# Patient Record
Sex: Male | Born: 1960 | Race: White | Hispanic: No | Marital: Married | Smoking: Never smoker
Health system: Southern US, Community
[De-identification: ages and names within clinical notes are randomized; demographics above are authoritative.]

## PROBLEM LIST (undated history)

## (undated) DIAGNOSIS — G4733 Obstructive sleep apnea (adult) (pediatric): Secondary | ICD-10-CM

## (undated) DIAGNOSIS — G4730 Sleep apnea, unspecified: Secondary | ICD-10-CM

## (undated) DIAGNOSIS — J302 Other seasonal allergic rhinitis: Secondary | ICD-10-CM

## (undated) HISTORY — DX: Other seasonal allergic rhinitis: J30.2

## (undated) HISTORY — DX: Sleep apnea, unspecified: G47.30

## (undated) HISTORY — DX: Obstructive sleep apnea (adult) (pediatric): G47.33

---

## 2008-12-26 DIAGNOSIS — G4733 Obstructive sleep apnea (adult) (pediatric): Secondary | ICD-10-CM

## 2008-12-26 HISTORY — DX: Obstructive sleep apnea (adult) (pediatric): G47.33

## 2013-12-03 LAB — HM COLONOSCOPY

## 2014-04-25 HISTORY — PX: COLONOSCOPY: SHX174

## 2015-11-09 ENCOUNTER — Telehealth: Payer: Self-pay | Admitting: General Practice

## 2015-11-09 NOTE — Telephone Encounter (Signed)
Lmovm to call back to schedule a new patient appt

## 2015-11-09 NOTE — Telephone Encounter (Signed)
yes

## 2015-11-09 NOTE — Telephone Encounter (Signed)
Pt is new to the area and some colleagues he works with had recommended you to him. Can you take him on as a new patient? Please advise

## 2015-12-07 ENCOUNTER — Ambulatory Visit (INDEPENDENT_AMBULATORY_CARE_PROVIDER_SITE_OTHER): Payer: 59 | Admitting: Internal Medicine

## 2015-12-07 ENCOUNTER — Other Ambulatory Visit (INDEPENDENT_AMBULATORY_CARE_PROVIDER_SITE_OTHER): Payer: 59

## 2015-12-07 ENCOUNTER — Encounter: Payer: Self-pay | Admitting: Internal Medicine

## 2015-12-07 VITALS — BP 108/68 | HR 65 | Temp 98.6°F | Resp 16 | Ht 72.0 in | Wt 280.0 lb

## 2015-12-07 DIAGNOSIS — G4733 Obstructive sleep apnea (adult) (pediatric): Secondary | ICD-10-CM

## 2015-12-07 DIAGNOSIS — Z0000 Encounter for general adult medical examination without abnormal findings: Secondary | ICD-10-CM | POA: Diagnosis not present

## 2015-12-07 DIAGNOSIS — R7989 Other specified abnormal findings of blood chemistry: Secondary | ICD-10-CM | POA: Diagnosis not present

## 2015-12-07 LAB — CBC WITH DIFFERENTIAL/PLATELET
Basophils Absolute: 0 10*3/uL (ref 0.0–0.1)
Basophils Relative: 0.2 % (ref 0.0–3.0)
Eosinophils Absolute: 0 10*3/uL (ref 0.0–0.7)
Eosinophils Relative: 0.6 % (ref 0.0–5.0)
HCT: 43.4 % (ref 39.0–52.0)
Hemoglobin: 14.6 g/dL (ref 13.0–17.0)
Lymphocytes Relative: 24.6 % (ref 12.0–46.0)
Lymphs Abs: 1.7 10*3/uL (ref 0.7–4.0)
MCHC: 33.6 g/dL (ref 30.0–36.0)
MCV: 85.2 fl (ref 78.0–100.0)
Monocytes Absolute: 0.7 10*3/uL (ref 0.1–1.0)
Monocytes Relative: 10.2 % (ref 3.0–12.0)
Neutro Abs: 4.5 10*3/uL (ref 1.4–7.7)
Neutrophils Relative %: 64.4 % (ref 43.0–77.0)
Platelets: 233 10*3/uL (ref 150.0–400.0)
RBC: 5.1 Mil/uL (ref 4.22–5.81)
RDW: 13.8 % (ref 11.5–15.5)
WBC: 7 10*3/uL (ref 4.0–10.5)

## 2015-12-07 LAB — LIPID PANEL
Cholesterol: 155 mg/dL (ref 0–200)
HDL: 32.9 mg/dL — ABNORMAL LOW (ref >39.00)
NonHDL: 121.62
Total CHOL/HDL Ratio: 5
Triglycerides: 285 mg/dL — ABNORMAL HIGH (ref 0.0–149.0)
VLDL: 57 mg/dL — ABNORMAL HIGH (ref 0.0–40.0)

## 2015-12-07 LAB — COMPREHENSIVE METABOLIC PANEL
ALT: 17 U/L (ref 0–53)
AST: 18 U/L (ref 0–37)
Albumin: 4.2 g/dL (ref 3.5–5.2)
Alkaline Phosphatase: 56 U/L (ref 39–117)
BUN: 12 mg/dL (ref 6–23)
CO2: 32 mEq/L (ref 19–32)
Calcium: 9.2 mg/dL (ref 8.4–10.5)
Chloride: 104 mEq/L (ref 96–112)
Creatinine, Ser: 1 mg/dL (ref 0.40–1.50)
GFR: 83.02 mL/min (ref >60.00)
Glucose, Bld: 101 mg/dL — ABNORMAL HIGH (ref 70–99)
Potassium: 4.4 mEq/L (ref 3.5–5.1)
Sodium: 142 mEq/L (ref 135–145)
Total Bilirubin: 0.4 mg/dL (ref 0.2–1.2)
Total Protein: 6.4 g/dL (ref 6.0–8.3)

## 2015-12-07 LAB — LDL CHOLESTEROL, DIRECT: Direct LDL: 94 mg/dL

## 2015-12-07 LAB — FECAL OCCULT BLOOD, GUAIAC: Fecal Occult Blood: NEGATIVE

## 2015-12-07 LAB — PSA: PSA: 1.19 ng/mL (ref 0.10–4.00)

## 2015-12-07 LAB — TSH: TSH: 0.94 u[IU]/mL (ref 0.35–4.50)

## 2015-12-07 NOTE — Progress Notes (Signed)
Subjective:  Patient ID: Geoffrey Evans, male    DOB: Mar 22, 1961  Age: 54 y.o. MRN: [id number]  CC: Annual Exam  New to me - no records available from [place].   HPI Geoffrey Evans presents for a CPX, he needs a CPAP titration and may need new equipment. He complains of weight gain over the last year.  History Geoffrey Evans has a past medical history of OSA (obstructive sleep apnea) (2010).   He has no past surgical history on file.   His family history includes Diabetes in his brother; Heart failure in his father. There is no history of Cancer, Alcohol abuse, Early death, Hyperlipidemia, Hypertension, Kidney disease, Learning disabilities, Stroke, COPD, or Depression.He reports that he has never smoked. He has never used smokeless tobacco. He reports that he does not drink alcohol or use illicit drugs.  No outpatient prescriptions prior to visit.   No facility-administered medications prior to visit.    ROS Review of Systems  Constitutional: Positive for unexpected weight change (wt gain). Negative for fever, chills, diaphoresis, activity change, appetite change and fatigue.  HENT: Negative.   Eyes: Negative.   Respiratory: Positive for apnea. Negative for cough, choking, chest tightness, shortness of breath, wheezing and stridor.   Cardiovascular: Negative.  Negative for chest pain, palpitations and leg swelling.  Gastrointestinal: Negative.  Negative for nausea, vomiting, abdominal pain, diarrhea, constipation and blood in stool.  Endocrine: Negative.   Genitourinary: Negative.  Negative for dysuria, urgency, frequency, hematuria, flank pain, scrotal swelling, difficulty urinating and testicular pain.  Musculoskeletal: Negative.  Negative for myalgias, back pain, joint swelling and arthralgias.  Skin: Negative.  Negative for color change.  Allergic/Immunologic: Negative.   Neurological: Negative.  Negative for dizziness, tremors, syncope, light-headedness and numbness.    Hematological: Negative.  Negative for adenopathy. Does not bruise/bleed easily.  Psychiatric/Behavioral: Negative.     Objective:  BP 108/68 mmHg  Pulse 65  Temp(Src) 98.6 F (37 C) (Oral)  Resp 16  Ht 6' (1.829 m)  Wt 280 lb (127.007 kg)  BMI 37.97 kg/m2  SpO2 96%  Physical Exam  Constitutional: He is oriented to person, place, and time. No distress.  HENT:  Mouth/Throat: Oropharynx is clear and moist. No oropharyngeal exudate.  Eyes: Conjunctivae are normal. Right eye exhibits no discharge. Left eye exhibits no discharge. No scleral icterus.  Neck: Normal range of motion. Neck supple. No JVD present. No tracheal deviation present. No thyromegaly present.  Cardiovascular: Normal rate, regular rhythm, normal heart sounds and intact distal pulses.  Exam reveals no gallop and no friction rub.   No murmur heard. Pulmonary/Chest: Effort normal and breath sounds normal. No stridor. No respiratory distress. He has no wheezes. He has no rales. He exhibits no tenderness.  Abdominal: Soft. Bowel sounds are normal. He exhibits no distension and no mass. There is no tenderness. There is no rebound and no guarding. Hernia confirmed negative in the right inguinal area and confirmed negative in the left inguinal area.  Genitourinary: Rectum normal, prostate normal, testes normal and penis normal. Rectal exam shows no external hemorrhoid, no internal hemorrhoid, no fissure, no mass, no tenderness and anal tone normal. Guaiac negative stool. Prostate is not enlarged and not tender. Right testis shows no mass, no swelling and no tenderness. Right testis is descended. Left testis shows no mass, no swelling and no tenderness. Left testis is descended. Circumcised. No penile erythema or penile tenderness. No discharge found.  Musculoskeletal: Normal range of motion. He exhibits no  edema or tenderness.  Lymphadenopathy:    He has no cervical adenopathy.       Right: No inguinal adenopathy present.        Left: No inguinal adenopathy present.  Neurological: He is oriented to person, place, and time.  Skin: Skin is warm and dry. No rash noted. He is not diaphoretic. No erythema. No pallor.  Psychiatric: He has a normal mood and affect. His behavior is normal. Judgment and thought content normal.  Vitals reviewed.     Assessment & Plan:   Geoffrey Evans was seen today for annual exam.  Diagnoses and all orders for this visit:  Routine general medical examination at a health care facility- he will have his medical records forwarded to me, vaccines were reviewed and updated today, exam done, labs ordered and reviewed, his colonoscopy is UTD, pt ed material was given. -     Lipid panel; Future -     TSH; Future -     Comprehensive metabolic panel; Future -     CBC with Differential/Platelet; Future -     PSA; Future -     Hepatitis C antibody; Future  OSA (obstructive sleep apnea) -     Ambulatory referral to Pulmonology  Geoffrey Evans does not currently have medications on file.  No orders of the defined types were placed in this encounter.     Follow-up: No Follow-up on file.  Scarlette Calico, MD

## 2015-12-07 NOTE — Progress Notes (Signed)
Pre visit review using our clinic review tool, if applicable. No additional management support is needed unless otherwise documented below in the visit note. 

## 2015-12-07 NOTE — Patient Instructions (Signed)

## 2015-12-08 ENCOUNTER — Encounter: Payer: Self-pay | Admitting: Internal Medicine

## 2015-12-08 LAB — HEPATITIS C ANTIBODY: HCV Ab: NEGATIVE

## 2016-03-23 ENCOUNTER — Ambulatory Visit (INDEPENDENT_AMBULATORY_CARE_PROVIDER_SITE_OTHER): Payer: 59 | Admitting: Pulmonary Disease

## 2016-03-23 ENCOUNTER — Encounter: Payer: Self-pay | Admitting: Pulmonary Disease

## 2016-03-23 VITALS — BP 110/82 | HR 54 | Ht 72.0 in | Wt 276.6 lb

## 2016-03-23 DIAGNOSIS — G4733 Obstructive sleep apnea (adult) (pediatric): Secondary | ICD-10-CM | POA: Diagnosis not present

## 2016-03-23 NOTE — Assessment & Plan Note (Signed)
Home sleep study Based on this , we will get you a new CPAP machine Meanwhile, Rx for new CPAP supplies  The pathophysiology of obstructive sleep apnea , it's cardiovascular consequences & modes of treatment including CPAP were discused with the patient in detail & they evidenced understanding.

## 2016-03-23 NOTE — Progress Notes (Signed)
   Subjective:    Patient ID: Geoffrey Evans, male    DOB: [birth year], 55 y.o.   MRN: [id number]  HPI    Review of Systems  Constitutional: Negative for fever, chills, activity change, appetite change and unexpected weight change.  HENT: Negative for congestion, dental problem, postnasal drip, rhinorrhea, sneezing, sore throat, trouble swallowing and voice change.   Eyes: Negative for visual disturbance.  Respiratory: Negative for cough, choking and shortness of breath.   Cardiovascular: Negative for chest pain and leg swelling.  Gastrointestinal: Negative for nausea, vomiting and abdominal pain.  Genitourinary: Negative for difficulty urinating.  Musculoskeletal: Negative for arthralgias.  Skin: Negative for rash.  Psychiatric/Behavioral: Negative for behavioral problems and confusion.       Objective:   Physical Exam        Assessment & Plan:

## 2016-03-23 NOTE — Patient Instructions (Signed)
Home sleep study Based on this , we will get you a new CPAP machine

## 2016-03-23 NOTE — Progress Notes (Signed)
Subjective:    Patient ID: Geoffrey Evans, male    DOB: 08/06/1961, 55 y.o.   MRN: [id number]  HPI  Chief Complaint  Patient presents with  . Sleep Consult    Referred by Dr. Ronnald Ramp: pt had sleep study done 12 years ago in [place], [place]; Pt has been using CPAP machine since then, machine is wearing down and needs new machine. Epworth Score: 44     55 year old man presents for evaluation of severe OSA. 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 He was diagnosed with severe OSA 12 years ago and he believes that he's had it all his life. He had dramatic improvement in his snoring and daytime somnolence and fatigue with use of CPAP. His CPAP is set at 9 cm and he uses a nasal mask. He is very compliant with this and has rarely missed any days. Epworth sleepiness score is 7 Bedtime is around 10 PM, he sleeps on his side with 2 pillows, sleep latency is minimal, reports 2-3 nocturnal awakenings due to ambient noise and is out of bed at 6 AM feeling refreshed without dryness of mouth or headaches. He denies nocturia There is no history suggestive of cataplexy, sleep paralysis or parasomnias  He has gained 25 pounds over the last 10 years since his sleep study He would also like to be set up with a new supplier   Past Medical History  Diagnosis Date  . OSA (obstructive sleep apnea) 2010    No past surgical history on file.  No Known Allergies  Social History   Social History  . Marital Status: Married    Spouse Name: N/A  . Number of Children: N/A  . Years of Education: N/A   Occupational History  . Not on file.   Social History Main Topics  . Smoking status: Never Smoker   . Smokeless tobacco: Former Systems developer    Types: [place] date: 03/23/1990  . Alcohol Use: No  . Drug Use: No  . Sexual Activity: Not Currently   Other Topics Concern  . Not on file   Social History Narrative    Family History  Problem Relation Age of  Onset  . Heart failure Father   . Diabetes Brother   . Cancer Neg Hx   . Alcohol abuse Neg Hx   . Early death Neg Hx   . Hyperlipidemia Neg Hx   . Hypertension Neg Hx   . Kidney disease Neg Hx   . Learning disabilities Neg Hx   . Stroke Neg Hx   . COPD Neg Hx   . Depression Neg Hx       Review of Systems neg for any significant sore throat, dysphagia, itching, sneezing, nasal congestion or excess/ purulent secretions, fever, chills, sweats, unintended wt loss, pleuritic or exertional cp, hempoptysis, orthopnea pnd or change in chronic leg swelling. Also denies presyncope, palpitations, heartburn, abdominal pain, nausea, vomiting, diarrhea or change in bowel or urinary habits, dysuria,hematuria, rash, arthralgias, visual complaints, headache, numbness weakness or ataxia.     Objective:   Physical Exam  Gen. Pleasant, obese, in no distress, normal affect ENT - no lesions, no post nasal drip, class 2-3 airway Neck: No JVD, no thyromegaly, no carotid bruits Lungs: no use of accessory muscles, no dullness to percussion, decreased without rales or rhonchi  Cardiovascular: Rhythm regular, heart sounds  normal, no murmurs or gallops, no peripheral edema Abdomen: soft  and non-tender, no hepatosplenomegaly, BS normal. Musculoskeletal: No deformities, no cyanosis or clubbing Neuro:  alert, non focal, no tremors        Assessment & Plan:

## 2016-04-12 DIAGNOSIS — J309 Allergic rhinitis, unspecified: Secondary | ICD-10-CM | POA: Diagnosis not present

## 2016-04-14 ENCOUNTER — Telehealth: Payer: Self-pay | Admitting: Pulmonary Disease

## 2016-04-14 DIAGNOSIS — J309 Allergic rhinitis, unspecified: Secondary | ICD-10-CM | POA: Diagnosis not present

## 2016-04-14 DIAGNOSIS — G4733 Obstructive sleep apnea (adult) (pediatric): Secondary | ICD-10-CM

## 2016-04-14 NOTE — Telephone Encounter (Signed)
Per Dr. Elsworth Soho:  Sleep Study results show mild sleep apnea - AHI: 5.7/hr.  Send RX for new CPAP at 9cm,. Download in 4 weeks, OV in 6 weeks. ------------------

## 2016-04-15 ENCOUNTER — Other Ambulatory Visit: Payer: Self-pay | Admitting: *Deleted

## 2016-04-15 DIAGNOSIS — G4733 Obstructive sleep apnea (adult) (pediatric): Secondary | ICD-10-CM

## 2016-04-20 NOTE — Telephone Encounter (Signed)
Patient notified of results. Order for CPAP entered. Patient will call to schedule appointment in 6 weeks after CPAP has been set up. Nothing further needed.

## 2016-07-11 ENCOUNTER — Telehealth: Payer: Self-pay | Admitting: Pulmonary Disease

## 2016-07-11 NOTE — Telephone Encounter (Signed)
Received letter from PAP compliance Team requesting information regarding follow up on CPAP set up.  Patient has not been seen since his CPAP was set up.  Needs appointment for CPAP follow up as soon as possible. Called and left message on patient's voicemail advising him to contact our office to schedule follow up appointment. Can be scheduled with NP for CPAP follow up.

## 2016-07-12 NOTE — Telephone Encounter (Addendum)
Wrong number for patient in chart.  Person that answered phone stated that he just purchased new phone and has received a lot of calls for this patient on his new phone.  He says that he must have gotten a new number.  Unable to reach patient.  Sent letter.

## 2016-08-11 ENCOUNTER — Ambulatory Visit (INDEPENDENT_AMBULATORY_CARE_PROVIDER_SITE_OTHER): Payer: 59 | Admitting: Nurse Practitioner

## 2016-08-11 ENCOUNTER — Encounter: Payer: Self-pay | Admitting: Nurse Practitioner

## 2016-08-11 VITALS — BP 122/58 | HR 72 | Temp 98.0°F

## 2016-08-11 DIAGNOSIS — T148 Other injury of unspecified body region: Secondary | ICD-10-CM

## 2016-08-11 DIAGNOSIS — W57XXXA Bitten or stung by nonvenomous insect and other nonvenomous arthropods, initial encounter: Secondary | ICD-10-CM | POA: Diagnosis not present

## 2016-08-11 DIAGNOSIS — R21 Rash and other nonspecific skin eruption: Secondary | ICD-10-CM

## 2016-08-11 DIAGNOSIS — L080 Pyoderma: Secondary | ICD-10-CM

## 2016-08-11 MED ORDER — DIPHENHYDRAMINE HCL 25 MG PO TABS
25.0000 mg | ORAL_TABLET | Freq: Four times a day (QID) | ORAL | 0 refills | Status: DC | PRN
Start: 2016-08-11 — End: 2016-10-17

## 2016-08-11 MED ORDER — PERMETHRIN 5 % EX CREA: 1.0000 "application " | TOPICAL_CREAM | Freq: Once | CUTANEOUS | 1 refills | Status: AC

## 2016-08-11 MED ORDER — CALAMINE EX LOTN: 1.0000 "application " | TOPICAL_LOTION | CUTANEOUS | 0 refills | Status: DC | PRN

## 2016-08-11 NOTE — Patient Instructions (Addendum)
Pt was advised to call if increased erythema/swelling, if symptoms worsen or if symptoms are not improved in 2week.  Add daily antihistamine such as zyrtec 10mg  once daily. OK to take 1 tablet of benadryl at bedtime as needed for itching.    Check bedding and clothing for any infestation. Wash in warm water and dry in dryer.Marland Kitchen

## 2016-08-11 NOTE — Progress Notes (Signed)
Subjective:  Patient ID: Geoffrey Evans, male    DOB: [birth year]  Age: 55 y.o. MRN: [id number]  CC: Rash   Rash  This is a new problem. The current episode started in the past 7 days. The problem has been gradually worsening since onset. The affected locations include the left ankle and right ankle. The rash is characterized by redness and itchiness. It is unknown (slept in [employer] while on road trip with son, also moved into new house with the last 2weeks.) if there was an exposure to a precipitant. Pertinent negatives include no anorexia, congestion, fatigue, fever, joint pain or shortness of breath. Past treatments include nothing.  Condition of new house was excellent, no sign of possible infestation. Previous owners had pets, but house was kept clean and renovated. Has been walking around yard in new house, denies any specific insect bites or stings His son has similar rash on lower extremities but worse that his. He shared room with son while on road trip.  No outpatient prescriptions prior to visit.   No facility-administered medications prior to visit.     ROS Review of Systems  Constitutional: Negative for fatigue and fever.  HENT: Negative for congestion.   Respiratory: Negative for shortness of breath.   Gastrointestinal: Negative for anorexia.  Musculoskeletal: Negative for joint pain.  Skin: Positive for rash. Negative for wound.  Hematological: Negative for adenopathy.    Objective:  BP (!) 122/58   Pulse 72   Temp 98 F (36.7 C)   SpO2 98%   BP Readings from Last 3 Encounters:  08/11/16 (!) 122/58  03/23/16 110/82  12/07/15 108/68    Wt Readings from Last 3 Encounters:  03/23/16 276 lb 9.6 oz (125.5 kg)  12/07/15 280 lb (127 kg)    Physical Exam  Constitutional: He is oriented to person, place, and time.  Cardiovascular: Normal rate.   Pulmonary/Chest: Effort normal.  Musculoskeletal: He exhibits no edema or tenderness.  Neurological: He is alert  and oriented to person, place, and time.  Skin: Skin is warm and dry. Rash noted. Rash is papular. There is erythema.     Discrete papular erythematous lesions on bilateral Le, blanchable.  Vitals reviewed.   Lab Results  Component Value Date   WBC 7.0 12/07/2015   HGB 14.6 12/07/2015   HCT 43.4 12/07/2015   PLT 233.0 12/07/2015   GLUCOSE 101 (H) 12/07/2015   CHOL 155 12/07/2015   TRIG 285.0 (H) 12/07/2015   HDL 32.90 (L) 12/07/2015   LDLDIRECT 94.0 12/07/2015   ALT 17 12/07/2015   AST 18 12/07/2015   NA 142 12/07/2015   K 4.4 12/07/2015   CL 104 12/07/2015   CREATININE 1.00 12/07/2015   BUN 12 12/07/2015   CO2 32 12/07/2015   TSH 0.94 12/07/2015   PSA 1.19 12/07/2015    Patient was never admitted.  Assessment & Plan:   [place] was seen today for rash.  Diagnoses and all orders for this visit:  Localized papular rash -     permethrin (ELIMITE) 5 % cream; Apply 1 application topically once. Apply to bilateral lower extremities, leave on for 8-14hrs, then rinse. May repeat application in 7days. -     diphenhydrAMINE (BENADRYL) 25 MG tablet; Take 1 tablet (25 mg total) by mouth every 6 (six) hours as needed for itching. -     calamine lotion; Apply 1 application topically as needed for itching.  Insect bites -     permethrin (ELIMITE) 5 %  cream; Apply 1 application topically once. Apply to bilateral lower extremities, leave on for 8-14hrs, then rinse. May repeat application in 7days. -     diphenhydrAMINE (BENADRYL) 25 MG tablet; Take 1 tablet (25 mg total) by mouth every 6 (six) hours as needed for itching. -     calamine lotion; Apply 1 application topically as needed for itching.   I am having [name] start on permethrin, diphenhydrAMINE, and calamine.  Meds ordered this encounter  Medications  . permethrin (ELIMITE) 5 % cream    Sig: Apply 1 application topically once. Apply to bilateral lower extremities, leave on for 8-14hrs, then rinse. May repeat  application in 7days.    Dispense:  60 g    Refill:  1    Order Specific Question:   Supervising Provider    Answer:   Cassandria Anger [[id number]]  . diphenhydrAMINE (BENADRYL) 25 MG tablet    Sig: Take 1 tablet (25 mg total) by mouth every 6 (six) hours as needed for itching.    Dispense:  30 tablet    Refill:  0    Order Specific Question:   Supervising Provider    Answer:   Cassandria Anger [[id number]]  . calamine lotion    Sig: Apply 1 application topically as needed for itching.    Dispense:  120 mL    Refill:  0    Order Specific Question:   Supervising Provider    Answer:   Cassandria Anger [[id number]]     Follow-up: Return if symptoms worsen or fail to improve.  Wilfred Lacy, NP

## 2016-08-11 NOTE — Progress Notes (Signed)
Pre visit review using our clinic review tool, if applicable. No additional management support is needed unless otherwise documented below in the visit note. 

## 2016-10-17 ENCOUNTER — Encounter: Payer: Self-pay | Admitting: Pulmonary Disease

## 2016-10-17 ENCOUNTER — Ambulatory Visit (INDEPENDENT_AMBULATORY_CARE_PROVIDER_SITE_OTHER): Payer: 59 | Admitting: Pulmonary Disease

## 2016-10-17 DIAGNOSIS — G4733 Obstructive sleep apnea (adult) (pediatric): Secondary | ICD-10-CM | POA: Diagnosis not present

## 2016-10-17 NOTE — Patient Instructions (Signed)
CPAP is set at 9 cm and is working well. CPAP supplies will be renewed for a year 

## 2016-10-17 NOTE — Assessment & Plan Note (Signed)
CPAP is set at 9 cm and is working well. CPAP supplies will be renewed for a year  Weight loss encouraged, compliance with goal of at least 4-6 hrs every night is the expectation. Advised against medications with sedative side effects Cautioned against driving when sleepy - understanding that sleepiness will vary on a day to day basis  

## 2016-10-17 NOTE — Progress Notes (Signed)
   Subjective:    Patient ID: Geoffrey Evans, male    DOB: 05/12/1961, 55 y.o.   MRN: [id number]  HPI  55 year old man for FU of severe OSA. 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 He was diagnosed with severe OSA 12 years ago and he believes that he's had it all his life. He had dramatic improvement in his snoring and daytime somnolence and fatigue with use of CPAP. His CPAP is set at 9 cm and he uses a nasal mask.    10/17/2016  Chief Complaint  Patient presents with  . Follow-up    f/u CPAP-Doing good.   He was set up with a new CPAP at 9 cm He reports good compliance with his No problems with mask or pressure He does not report any dryness or nasal stuffiness  CPAP download shows good control of events without significant leak  His weight is mostly unchanged  Significant tests/ events  HST 03/2016 >> mild sleep apnea - AHI: 5.7/hr.  Send RX for new CPAP at 9cm  Review of Systems Patient denies significant dyspnea,cough, hemoptysis,  chest pain, palpitations, pedal edema, orthopnea, paroxysmal nocturnal dyspnea, lightheadedness, nausea, vomiting, abdominal or  leg pains      Objective:   Physical Exam  Gen. Pleasant, obese, in no distress ENT - no lesions, no post nasal drip Neck: No JVD, no thyromegaly, no carotid bruits Lungs: no use of accessory muscles, no dullness to percussion, decreased without rales or rhonchi  Cardiovascular: Rhythm regular, heart sounds  normal, no murmurs or gallops, no peripheral edema Musculoskeletal: No deformities, no cyanosis or clubbing , no tremors       Assessment & Plan:

## 2016-11-14 ENCOUNTER — Encounter: Payer: Self-pay | Admitting: Pulmonary Disease

## 2017-01-25 DIAGNOSIS — G4733 Obstructive sleep apnea (adult) (pediatric): Secondary | ICD-10-CM | POA: Diagnosis not present

## 2017-01-30 DIAGNOSIS — G4733 Obstructive sleep apnea (adult) (pediatric): Secondary | ICD-10-CM | POA: Diagnosis not present

## 2017-02-22 DIAGNOSIS — G4733 Obstructive sleep apnea (adult) (pediatric): Secondary | ICD-10-CM | POA: Diagnosis not present

## 2017-03-25 DIAGNOSIS — G4733 Obstructive sleep apnea (adult) (pediatric): Secondary | ICD-10-CM | POA: Diagnosis not present

## 2017-04-13 ENCOUNTER — Other Ambulatory Visit (INDEPENDENT_AMBULATORY_CARE_PROVIDER_SITE_OTHER): Payer: 59

## 2017-04-13 ENCOUNTER — Encounter: Payer: Self-pay | Admitting: Internal Medicine

## 2017-04-13 ENCOUNTER — Ambulatory Visit (INDEPENDENT_AMBULATORY_CARE_PROVIDER_SITE_OTHER): Payer: 59 | Admitting: Internal Medicine

## 2017-04-13 VITALS — BP 130/64 | HR 69 | Temp 97.7°F | Ht 72.0 in | Wt 276.0 lb

## 2017-04-13 DIAGNOSIS — D229 Melanocytic nevi, unspecified: Secondary | ICD-10-CM

## 2017-04-13 DIAGNOSIS — Z0000 Encounter for general adult medical examination without abnormal findings: Secondary | ICD-10-CM

## 2017-04-13 DIAGNOSIS — R7989 Other specified abnormal findings of blood chemistry: Secondary | ICD-10-CM | POA: Diagnosis not present

## 2017-04-13 DIAGNOSIS — K635 Polyp of colon: Secondary | ICD-10-CM | POA: Insufficient documentation

## 2017-04-13 LAB — CBC WITH DIFFERENTIAL/PLATELET
Basophils Absolute: 0 10*3/uL (ref 0.0–0.1)
Basophils Relative: 0.4 % (ref 0.0–3.0)
Eosinophils Absolute: 0.1 10*3/uL (ref 0.0–0.7)
Eosinophils Relative: 1.2 % (ref 0.0–5.0)
HCT: 43.7 % (ref 39.0–52.0)
Hemoglobin: 14.6 g/dL (ref 13.0–17.0)
Lymphocytes Relative: 26.1 % (ref 12.0–46.0)
Lymphs Abs: 1.7 10*3/uL (ref 0.7–4.0)
MCHC: 33.3 g/dL (ref 30.0–36.0)
MCV: 84.9 fl (ref 78.0–100.0)
Monocytes Absolute: 0.6 10*3/uL (ref 0.1–1.0)
Monocytes Relative: 9.6 % (ref 3.0–12.0)
Neutro Abs: 4 10*3/uL (ref 1.4–7.7)
Neutrophils Relative %: 62.7 % (ref 43.0–77.0)
Platelets: 221 10*3/uL (ref 150.0–400.0)
RBC: 5.14 Mil/uL (ref 4.22–5.81)
RDW: 14.2 % (ref 11.5–15.5)
WBC: 6.3 10*3/uL (ref 4.0–10.5)

## 2017-04-13 LAB — COMPREHENSIVE METABOLIC PANEL
ALT: 19 U/L (ref 0–53)
AST: 21 U/L (ref 0–37)
Albumin: 4.2 g/dL (ref 3.5–5.2)
Alkaline Phosphatase: 59 U/L (ref 39–117)
BUN: 13 mg/dL (ref 6–23)
CO2: 24 mEq/L (ref 19–32)
Calcium: 9.1 mg/dL (ref 8.4–10.5)
Chloride: 106 mEq/L (ref 96–112)
Creatinine, Ser: 0.98 mg/dL (ref 0.40–1.50)
GFR: 84.55 mL/min (ref >60.00)
Glucose, Bld: 104 mg/dL — ABNORMAL HIGH (ref 70–99)
Potassium: 4.1 mEq/L (ref 3.5–5.1)
Sodium: 139 mEq/L (ref 135–145)
Total Bilirubin: 0.4 mg/dL (ref 0.2–1.2)
Total Protein: 6.7 g/dL (ref 6.0–8.3)

## 2017-04-13 LAB — LIPID PANEL
Cholesterol: 176 mg/dL (ref 0–200)
HDL: 32.2 mg/dL — ABNORMAL LOW (ref >39.00)
NonHDL: 143.5
Total CHOL/HDL Ratio: 5
Triglycerides: 261 mg/dL — ABNORMAL HIGH (ref 0.0–149.0)
VLDL: 52.2 mg/dL — ABNORMAL HIGH (ref 0.0–40.0)

## 2017-04-13 LAB — LDL CHOLESTEROL, DIRECT: Direct LDL: 110 mg/dL

## 2017-04-13 LAB — TSH: TSH: 1.14 u[IU]/mL (ref 0.35–4.50)

## 2017-04-13 LAB — PSA: PSA: 1.33 ng/mL (ref 0.10–4.00)

## 2017-04-13 NOTE — Patient Instructions (Signed)
 Health Maintenance, Male A healthy lifestyle and preventive care is important for your health and wellness. Ask your health care provider about what schedule of regular examinations is right for you. What should I know about weight and diet?  Eat a Healthy Diet  Eat plenty of vegetables, fruits, whole grains, low-fat dairy products, and lean protein.  Do not eat a lot of foods high in solid fats, added sugars, or salt. Maintain a Healthy Weight  Regular exercise can help you achieve or maintain a healthy weight. You should:  Do at least 150 minutes of exercise each week. The exercise should increase your heart rate and make you sweat (moderate-intensity exercise).  Do strength-training exercises at least twice a week. Watch Your Levels of Cholesterol and Blood Lipids  Have your blood tested for lipids and cholesterol every 5 years starting at 55 years of age. If you are at high risk for heart disease, you should start having your blood tested when you are 55 years old. You may need to have your cholesterol levels checked more often if:  Your lipid or cholesterol levels are high.  You are older than 55 years of age.  You are at high risk for heart disease. What should I know about cancer screening? Many types of cancers can be detected early and may often be prevented. Lung Cancer  You should be screened every year for lung cancer if:  You are a current smoker who has smoked for at least 30 years.  You are a former smoker who has quit within the past 15 years.  Talk to your health care provider about your screening options, when you should start screening, and how often you should be screened. Colorectal Cancer  Routine colorectal cancer screening usually begins at 55 years of age and should be repeated every 5-10 years until you are 55 years old. You may need to be screened more often if early forms of precancerous polyps or small growths are found. Your health care provider  may recommend screening at an earlier age if you have risk factors for colon cancer.  Your health care provider may recommend using home test kits to check for hidden blood in the stool.  A small camera at the end of a tube can be used to examine your colon (sigmoidoscopy or colonoscopy). This checks for the earliest forms of colorectal cancer. Prostate and Testicular Cancer  Depending on your age and overall health, your health care provider may do certain tests to screen for prostate and testicular cancer.  Talk to your health care provider about any symptoms or concerns you have about testicular or prostate cancer. Skin Cancer  Check your skin from head to toe regularly.  Tell your health care provider about any new moles or changes in moles, especially if:  There is a change in a mole's size, shape, or color.  You have a mole that is larger than a pencil eraser.  Always use sunscreen. Apply sunscreen liberally and repeat throughout the day.  Protect yourself by wearing long sleeves, pants, a wide-brimmed hat, and sunglasses when outside. What should I know about heart disease, diabetes, and high blood pressure?  If you are 18-39 years of age, have your blood pressure checked every 3-5 years. If you are 40 years of age or older, have your blood pressure checked every year. You should have your blood pressure measured twice-once when you are at a hospital or clinic, and once when you are not at   a hospital or clinic. Record the average of the two measurements. To check your blood pressure when you are not at a hospital or clinic, you can use:  An automated blood pressure machine at a pharmacy.  A home blood pressure monitor.  Talk to your health care provider about your target blood pressure.  If you are between 45-79 years old, ask your health care provider if you should take aspirin to prevent heart disease.  Have regular diabetes screenings by checking your fasting blood sugar  level.  If you are at a normal weight and have a low risk for diabetes, have this test once every three years after the age of 45.  If you are overweight and have a high risk for diabetes, consider being tested at a younger age or more often.  A one-time screening for abdominal aortic aneurysm (AAA) by ultrasound is recommended for men aged 65-75 years who are current or former smokers. What should I know about preventing infection? Hepatitis B  If you have a higher risk for hepatitis B, you should be screened for this virus. Talk with your health care provider to find out if you are at risk for hepatitis B infection. Hepatitis C  Blood testing is recommended for:  Everyone born from 1945 through 1965.  Anyone with known risk factors for hepatitis C. Sexually Transmitted Diseases (STDs)  You should be screened each year for STDs including gonorrhea and chlamydia if:  You are sexually active and are younger than 55 years of age.  You are older than 55 years of age and your health care provider tells you that you are at risk for this type of infection.  Your sexual activity has changed since you were last screened and you are at an increased risk for chlamydia or gonorrhea. Ask your health care provider if you are at risk.  Talk with your health care provider about whether you are at high risk of being infected with HIV. Your health care provider may recommend a prescription medicine to help prevent HIV infection. What else can I do?  Schedule regular health, dental, and eye exams.  Stay current with your vaccines (immunizations).  Do not use any tobacco products, such as cigarettes, chewing tobacco, and e-cigarettes. If you need help quitting, ask your health care provider.  Limit alcohol intake to no more than 2 drinks per day. One drink equals 12 ounces of beer, 5 ounces of wine, or 1 ounces of hard liquor.  Do not use street drugs.  Do not share needles.  Ask your health  care provider for help if you need support or information about quitting drugs.  Tell your health care provider if you often feel depressed.  Tell your health care provider if you have ever been abused or do not feel safe at home. This information is not intended to replace advice given to you by your health care provider. Make sure you discuss any questions you have with your health care provider. Document Released: 06/09/2008 Document Revised: 08/10/2016 Document Reviewed: 09/15/2015 Elsevier Interactive Patient Education  2017 [employer]  

## 2017-04-13 NOTE — Progress Notes (Signed)
Subjective:  Patient ID: Geoffrey Evans, male    DOB: 08/18/1961  Age: 56 y.o. MRN: [id number]  CC: Annual Exam   HPI Geoffrey Evans presents for a CPX - he feels well and offers no complaints. He is due for a 3 yr f/up colonoscopy regarding colon polyps. He also wants to be referred to a dermatologist because he has fair skin although he has no moles or skin lesions that are concerning him at this time.  Outpatient Medications Prior to Visit  Medication Sig Dispense Refill  . Multiple Vitamins-Minerals (MULTIVITAMIN WITH MINERALS) tablet Take 1 tablet by mouth daily.     No facility-administered medications prior to visit.     ROS Review of Systems  Constitutional: Negative for appetite change, diaphoresis, fatigue and unexpected weight change.  HENT: Negative.   Eyes: Negative for visual disturbance.  Respiratory: Negative for cough, chest tightness, shortness of breath and wheezing.   Cardiovascular: Negative for chest pain, palpitations and leg swelling.  Gastrointestinal: Negative for abdominal pain, diarrhea, nausea and vomiting.  Endocrine: Negative.   Genitourinary: Negative.  Negative for difficulty urinating and hematuria.  Musculoskeletal: Negative for back pain and neck pain.  Skin: Negative.  Negative for rash.  Allergic/Immunologic: Negative.   Neurological: Negative.  Negative for dizziness, weakness and numbness.  Hematological: Negative for adenopathy. Does not bruise/bleed easily.  Psychiatric/Behavioral: Negative.     Objective:  BP 130/64 (BP Location: Left Arm, Patient Position: Sitting, Cuff Size: Large)   Pulse 69   Temp 97.7 F (36.5 C) (Oral)   Ht 6' (1.829 m)   Wt 276 lb (125.2 kg)   SpO2 99%   BMI 37.43 kg/m   BP Readings from Last 3 Encounters:  04/13/17 130/64  10/17/16 136/70  08/11/16 (!) 122/58    Wt Readings from Last 3 Encounters:  04/13/17 276 lb (125.2 kg)  10/17/16 279 lb 9.6 oz (126.8 kg)  03/23/16 276 lb 9.6 oz  (125.5 kg)    Physical Exam  Constitutional: He is oriented to person, place, and time. No distress.  HENT:  Mouth/Throat: Oropharynx is clear and moist. No oropharyngeal exudate.  Eyes: Conjunctivae are normal. Right eye exhibits no discharge. Left eye exhibits no discharge. No scleral icterus.  Neck: Normal range of motion. Neck supple. No JVD present. No tracheal deviation present. No thyromegaly present.  Cardiovascular: Normal rate, regular rhythm, normal heart sounds and intact distal pulses.  Exam reveals no gallop and no friction rub.   No murmur heard. Pulmonary/Chest: Effort normal and breath sounds normal. No stridor. No respiratory distress. He has no wheezes. He has no rales. He exhibits no tenderness.  Abdominal: Soft. Bowel sounds are normal. He exhibits no distension and no mass. There is no tenderness. There is no rebound and no guarding. Hernia confirmed negative in the right inguinal area and confirmed negative in the left inguinal area.  Genitourinary: Rectum normal, prostate normal, testes normal and penis normal. Rectal exam shows no external hemorrhoid, no internal hemorrhoid, no fissure, no mass, no tenderness, anal tone normal and guaiac negative stool. Prostate is not enlarged and not tender. Right testis shows no mass, no swelling and no tenderness. Right testis is descended. Left testis shows no mass, no swelling and no tenderness. Left testis is descended. Circumcised. No penile erythema or penile tenderness. No discharge found.  Musculoskeletal: Normal range of motion. He exhibits no edema, tenderness or deformity.  Lymphadenopathy:    He has no cervical adenopathy.  Right: No inguinal adenopathy present.       Left: No inguinal adenopathy present.  Neurological: He is oriented to person, place, and time.  Skin: Skin is warm. No rash noted. He is not diaphoretic. No erythema. No pallor.  Vitals reviewed.   Lab Results  Component Value Date   WBC 7.0  12/07/2015   HGB 14.6 12/07/2015   HCT 43.4 12/07/2015   PLT 233.0 12/07/2015   GLUCOSE 101 (H) 12/07/2015   CHOL 155 12/07/2015   TRIG 285.0 (H) 12/07/2015   HDL 32.90 (L) 12/07/2015   LDLDIRECT 94.0 12/07/2015   ALT 17 12/07/2015   AST 18 12/07/2015   NA 142 12/07/2015   K 4.4 12/07/2015   CL 104 12/07/2015   CREATININE 1.00 12/07/2015   BUN 12 12/07/2015   CO2 32 12/07/2015   TSH 0.94 12/07/2015   PSA 1.19 12/07/2015    Patient was never admitted.  Assessment & Plan:   Geoffrey Evans was seen today for annual exam.  Diagnoses and all orders for this visit:  Routine general medical examination at a health care facility- exam completed, labs ordered and reviewed, vaccines reviewed, patient education material was given. He was referred for repeat colonoscopy. -     Lipid panel; Future -     Comprehensive metabolic panel; Future -     CBC with Differential/Platelet; Future -     PSA; Future -     TSH; Future  Polyp of colon, unspecified part of colon, unspecified type -     Ambulatory referral to Gastroenterology  Nevus -     Ambulatory referral to Dermatology   I am having Geoffrey Evans maintain his multivitamin with minerals.  No orders of the defined types were placed in this encounter.    Follow-up: No Follow-up on file.  Scarlette Calico, MD

## 2017-04-13 NOTE — Progress Notes (Signed)
Pre visit review using our clinic review tool, if applicable. No additional management support is needed unless otherwise documented below in the visit note. 

## 2017-04-16 ENCOUNTER — Encounter: Payer: Self-pay | Admitting: Internal Medicine

## 2017-05-08 DIAGNOSIS — L570 Actinic keratosis: Secondary | ICD-10-CM | POA: Diagnosis not present

## 2017-05-30 ENCOUNTER — Encounter: Payer: Self-pay | Admitting: Internal Medicine

## 2017-06-27 DIAGNOSIS — G4733 Obstructive sleep apnea (adult) (pediatric): Secondary | ICD-10-CM | POA: Diagnosis not present

## 2017-06-30 ENCOUNTER — Telehealth: Payer: Self-pay | Admitting: Internal Medicine

## 2017-06-30 NOTE — Telephone Encounter (Signed)
Referral received for patient to have next colon at this office. Last colon 2015. Report printed from system and placed on DOD desk for referral date 04/13/17; Dr. Henrene Pastor for review.

## 2017-07-05 NOTE — Telephone Encounter (Signed)
Left message for patient to call back. Dr.Perry reviewed records and accepted patient to schedule direct colon, but wants patient to get pathology from previous colon if possible.

## 2017-07-06 ENCOUNTER — Encounter: Payer: Self-pay | Admitting: Internal Medicine

## 2017-07-06 DIAGNOSIS — L570 Actinic keratosis: Secondary | ICD-10-CM | POA: Diagnosis not present

## 2017-08-22 LAB — HM COLONOSCOPY

## 2017-09-01 ENCOUNTER — Ambulatory Visit (AMBULATORY_SURGERY_CENTER): Payer: Self-pay | Admitting: *Deleted

## 2017-09-01 VITALS — Ht 70.75 in | Wt 284.4 lb

## 2017-09-01 DIAGNOSIS — Z86010 Personal history of colonic polyps: Secondary | ICD-10-CM

## 2017-09-01 MED ORDER — NA SULFATE-K SULFATE-MG SULF 17.5-3.13-1.6 GM/177ML PO SOLN: 1.0000 | Freq: Once | ORAL | 0 refills | Status: AC

## 2017-09-01 NOTE — Progress Notes (Signed)
Denies allergies to eggs or soy products. Denies complications with sedation or anesthesia. Denies O2 use. Denies use of diet or weight loss medications.  Emmi instructions given for colonoscopy.  

## 2017-09-05 ENCOUNTER — Encounter: Payer: Self-pay | Admitting: Internal Medicine

## 2017-09-19 ENCOUNTER — Ambulatory Visit (AMBULATORY_SURGERY_CENTER): Payer: 59 | Admitting: Internal Medicine

## 2017-09-19 ENCOUNTER — Encounter: Payer: Self-pay | Admitting: Internal Medicine

## 2017-09-19 VITALS — BP 95/50 | HR 56 | Temp 97.3°F | Resp 15 | Ht 70.75 in | Wt 284.0 lb

## 2017-09-19 DIAGNOSIS — D128 Benign neoplasm of rectum: Secondary | ICD-10-CM

## 2017-09-19 DIAGNOSIS — Z86010 Personal history of colonic polyps: Secondary | ICD-10-CM

## 2017-09-19 DIAGNOSIS — K621 Rectal polyp: Secondary | ICD-10-CM | POA: Diagnosis not present

## 2017-09-19 DIAGNOSIS — G4733 Obstructive sleep apnea (adult) (pediatric): Secondary | ICD-10-CM | POA: Diagnosis not present

## 2017-09-19 MED ORDER — SODIUM CHLORIDE 0.9 % IV SOLN: 500.0000 mL | INTRAVENOUS | Status: DC

## 2017-09-19 NOTE — Progress Notes (Signed)
Called to room to assist during endoscopic procedure.  Patient ID and intended procedure confirmed with present staff. Received instructions for my participation in the procedure from the performing physician.  

## 2017-09-19 NOTE — Patient Instructions (Signed)
**   Handouts given on polyps and hemorrhoids  YOU HAD AN ENDOSCOPIC PROCEDURE TODAY AT THE [facility]:   Refer to the procedure report that was given to you for any specific questions about what was found during the examination.  If the procedure report does not answer your questions, please call your gastroenterologist to clarify.  If you requested that your care partner not be given the details of your procedure findings, then the procedure report has been included in a sealed envelope for you to review at your convenience later.  YOU SHOULD EXPECT: Some feelings of bloating in the abdomen. Passage of more gas than usual.  Walking can help get rid of the air that was put into your GI tract during the procedure and reduce the bloating. If you had a lower endoscopy (such as a colonoscopy or flexible sigmoidoscopy) you may notice spotting of blood in your stool or on the toilet paper. If you underwent a bowel prep for your procedure, you may not have a normal bowel movement for a few days.  Please Note:  You might notice some irritation and congestion in your nose or some drainage.  This is from the oxygen used during your procedure.  There is no need for concern and it should clear up in a day or so.  SYMPTOMS TO REPORT IMMEDIATELY:   Following lower endoscopy (colonoscopy or flexible sigmoidoscopy):  Excessive amounts of blood in the stool  Significant tenderness or worsening of abdominal pains  Swelling of the abdomen that is new, acute  Fever of 100F or higher   For urgent or emergent issues, a gastroenterologist can be reached at any hour by calling [phone].   DIET:  We do recommend a small meal at first, but then you may proceed to your regular diet.  Drink plenty of fluids but you should avoid alcoholic beverages for 24 hours.  ACTIVITY:  You should plan to take it easy for the rest of today and you should NOT DRIVE or use heavy machinery until tomorrow (because of the  sedation medicines used during the test).    FOLLOW UP: Our staff will call the number listed on your records the next business day following your procedure to check on you and address any questions or concerns that you may have regarding the information given to you following your procedure. If we do not reach you, we will leave a message.  However, if you are feeling well and you are not experiencing any problems, there is no need to return our call.  We will assume that you have returned to your regular daily activities without incident.  If any biopsies were taken you will be contacted by phone or by letter within the next 1-3 weeks.  Please call us at [phone] if you have not heard about the biopsies in 3 weeks.    SIGNATURES/CONFIDENTIALITY: You and/or your care partner have signed paperwork which will be entered into your electronic medical record.  These signatures attest to the fact that that the information above on your After Visit Summary has been reviewed and is understood.  Full responsibility of the confidentiality of this discharge information lies with you and/or your care-partner. 

## 2017-09-19 NOTE — Op Note (Signed)
[facility] Patient Name: Geoffrey Evans See Procedure Date: 09/19/2017 3:39 PM MRN: [id number] Endoscopist: Docia Chuck. Henrene Pastor , MD Age: 56 Referring MD:  Date of Birth: [birth year] Gender: Male Account #: [id number] Procedure:                Colonoscopy, with cold snare polypectomy x 1 Indications:              High risk colon cancer surveillance: Personal                            history of adenoma (10 mm or greater in size). In                            next examination [facility] Medicines:                Monitored Anesthesia Care Procedure:                Pre-Anesthesia Assessment:                           - Prior to the procedure, a History and Physical                            was performed, and patient medications and                            allergies were reviewed. The patient's tolerance of                            previous anesthesia was also reviewed. The risks                            and benefits of the procedure and the sedation                            options and risks were discussed with the patient.                            All questions were answered, and informed consent                            was obtained. Prior Anticoagulants: The patient has                            taken no previous anticoagulant or antiplatelet                            agents. ASA Grade Assessment: II - A patient with                            mild systemic disease. After reviewing the risks                            and benefits, the patient was deemed in  satisfactory condition to undergo the procedure.                           After obtaining informed consent, the colonoscope                            was passed under direct vision. Throughout the                            procedure, the patient's blood pressure, pulse, and                            oxygen saturations were monitored continuously. The            Model CF-HQ190L [id number] scope was introduced                            through the anus and advanced to the the cecum,                            identified by appendiceal orifice and ileocecal                            valve. The ileocecal valve, appendiceal orifice,                            and rectum were photographed. The quality of the                            bowel preparation was excellent. The colonoscopy                            was performed without difficulty. The patient                            tolerated the procedure well. The bowel preparation                            used was SUPREP. Scope In: 3:54:14 PM Scope Out: 4:15:58 PM Scope Withdrawal Time: 0 hours 16 minutes 5 seconds  Total Procedure Duration: 0 hours 21 minutes 44 seconds  Findings:                 A 3 mm polyp was found in the rectum. The polyp was                            removed with a cold snare. Resection and retrieval                            were complete.                           Internal hemorrhoids were found during retroflexion.  The exam was otherwise without abnormality on                            direct and retroflexion views. Complications:            No immediate complications. Estimated blood loss:                            None. Estimated Blood Loss:     Estimated blood loss: none. Impression:               - One 3 mm polyp in the rectum, removed with a cold                            snare. Resected and retrieved.                           - Internal hemorrhoids.                           - The examination was otherwise normal on direct                            and retroflexion views. Recommendation:           - Repeat colonoscopy in 5 years for surveillance.                           - Patient has a contact number available for                            emergencies. The signs and symptoms of potential                             delayed complications were discussed with the                            patient. Return to normal activities tomorrow.                            Written discharge instructions were provided to the                            patient.                           - Resume previous diet.                           - Continue present medications.                           - Await pathology results. Docia Chuck. Henrene Pastor, MD 09/19/2017 4:20:16 PM This report has been signed electronically.

## 2017-09-19 NOTE — Progress Notes (Signed)
Pt's states no medical or surgical changes since previsit or office visit. 

## 2017-09-19 NOTE — Progress Notes (Signed)
Report to PACU, RN, vss, BBS= Clear.  

## 2017-09-20 ENCOUNTER — Telehealth: Payer: Self-pay | Admitting: *Deleted

## 2017-09-20 LAB — HM COLONOSCOPY

## 2017-09-20 NOTE — Telephone Encounter (Signed)
  Follow up Call-  Call back number 09/19/2017  Post procedure Call Back phone  # [phone]  Permission to leave phone message Yes  Some recent data might be hidden     Patient questions:  Do you have a fever, pain , or abdominal swelling? No. Pain Score  0 *  Have you tolerated food without any problems? Yes.    Have you been able to return to your normal activities? Yes.    Do you have any questions about your discharge instructions: Diet   No. Medications  No. Follow up visit  No.  Do you have questions or concerns about your Care? No.  Actions: * If pain score is 4 or above: No action needed, pain <4.

## 2017-09-25 ENCOUNTER — Encounter: Payer: Self-pay | Admitting: Internal Medicine

## 2017-12-20 DIAGNOSIS — G4733 Obstructive sleep apnea (adult) (pediatric): Secondary | ICD-10-CM | POA: Diagnosis not present

## 2018-03-05 DIAGNOSIS — D225 Melanocytic nevi of trunk: Secondary | ICD-10-CM | POA: Diagnosis not present

## 2018-03-05 DIAGNOSIS — L814 Other melanin hyperpigmentation: Secondary | ICD-10-CM | POA: Diagnosis not present

## 2018-03-05 DIAGNOSIS — D1801 Hemangioma of skin and subcutaneous tissue: Secondary | ICD-10-CM | POA: Diagnosis not present

## 2018-03-20 DIAGNOSIS — G4733 Obstructive sleep apnea (adult) (pediatric): Secondary | ICD-10-CM | POA: Diagnosis not present

## 2018-04-16 DIAGNOSIS — L578 Other skin changes due to chronic exposure to nonionizing radiation: Secondary | ICD-10-CM | POA: Diagnosis not present

## 2018-04-16 DIAGNOSIS — L821 Other seborrheic keratosis: Secondary | ICD-10-CM | POA: Diagnosis not present

## 2018-04-16 DIAGNOSIS — L570 Actinic keratosis: Secondary | ICD-10-CM | POA: Diagnosis not present

## 2018-06-20 DIAGNOSIS — G4733 Obstructive sleep apnea (adult) (pediatric): Secondary | ICD-10-CM | POA: Diagnosis not present

## 2018-09-15 DIAGNOSIS — Z23 Encounter for immunization: Secondary | ICD-10-CM | POA: Diagnosis not present

## 2018-09-24 DIAGNOSIS — G4733 Obstructive sleep apnea (adult) (pediatric): Secondary | ICD-10-CM | POA: Diagnosis not present

## 2020-03-30 ENCOUNTER — Telehealth: Payer: Self-pay

## 2020-03-30 NOTE — Telephone Encounter (Signed)
New message    The patient's call needs a referral to audiology.    Last seen 4.19.2018  Appt is made for Physical on  4.22.21

## 2020-03-31 NOTE — Telephone Encounter (Signed)
Referral request will be addressed and documented at upcoming appointment.

## 2020-04-16 ENCOUNTER — Encounter: Payer: Self-pay | Admitting: Internal Medicine

## 2020-04-16 ENCOUNTER — Other Ambulatory Visit: Payer: Self-pay

## 2020-04-16 ENCOUNTER — Ambulatory Visit (INDEPENDENT_AMBULATORY_CARE_PROVIDER_SITE_OTHER): Payer: 59 | Admitting: Internal Medicine

## 2020-04-16 VITALS — BP 120/76 | HR 56 | Temp 98.5°F | Ht 70.75 in | Wt 272.0 lb

## 2020-04-16 DIAGNOSIS — H9193 Unspecified hearing loss, bilateral: Secondary | ICD-10-CM | POA: Insufficient documentation

## 2020-04-16 DIAGNOSIS — Z0000 Encounter for general adult medical examination without abnormal findings: Secondary | ICD-10-CM | POA: Diagnosis not present

## 2020-04-16 DIAGNOSIS — R739 Hyperglycemia, unspecified: Secondary | ICD-10-CM | POA: Diagnosis not present

## 2020-04-16 DIAGNOSIS — N521 Erectile dysfunction due to diseases classified elsewhere: Secondary | ICD-10-CM

## 2020-04-16 LAB — TSH: TSH: 1.68 u[IU]/mL (ref 0.35–4.50)

## 2020-04-16 LAB — LIPID PANEL
Cholesterol: 183 mg/dL (ref 0–200)
HDL: 32.4 mg/dL — ABNORMAL LOW (ref >39.00)
LDL Cholesterol: 117 mg/dL — ABNORMAL HIGH (ref 0–99)
NonHDL: 150.79
Total CHOL/HDL Ratio: 6
Triglycerides: 170 mg/dL — ABNORMAL HIGH (ref 0.0–149.0)
VLDL: 34 mg/dL (ref 0.0–40.0)

## 2020-04-16 LAB — HEPATIC FUNCTION PANEL
ALT: 18 U/L (ref 0–53)
AST: 21 U/L (ref 0–37)
Albumin: 4.4 g/dL (ref 3.5–5.2)
Alkaline Phosphatase: 54 U/L (ref 39–117)
Bilirubin, Direct: 0.1 mg/dL (ref 0.0–0.3)
Total Bilirubin: 0.5 mg/dL (ref 0.2–1.2)
Total Protein: 6.9 g/dL (ref 6.0–8.3)

## 2020-04-16 LAB — BASIC METABOLIC PANEL
BUN: 13 mg/dL (ref 6–23)
CO2: 30 mEq/L (ref 19–32)
Calcium: 9.1 mg/dL (ref 8.4–10.5)
Chloride: 102 mEq/L (ref 96–112)
Creatinine, Ser: 1.02 mg/dL (ref 0.40–1.50)
GFR: 75.14 mL/min (ref >60.00)
Glucose, Bld: 89 mg/dL (ref 70–99)
Potassium: 4.3 mEq/L (ref 3.5–5.1)
Sodium: 138 mEq/L (ref 135–145)

## 2020-04-16 LAB — HEMOGLOBIN A1C: Hgb A1c MFr Bld: 5.3 % (ref 4.6–6.5)

## 2020-04-16 LAB — PSA: PSA: 1.09 ng/mL (ref 0.10–4.00)

## 2020-04-16 MED ORDER — VARDENAFIL HCL 10 MG PO TABS: 10.0000 mg | ORAL_TABLET | Freq: Every day | ORAL | 5 refills | Status: DC | PRN

## 2020-04-16 NOTE — Progress Notes (Signed)
Subjective:  Patient ID: Geoffrey Evans, male    DOB: 1961/02/03  Age: 59 y.o. MRN: [id number]  CC: Annual Exam  This visit occurred during the SARS-CoV-2 public health emergency.  Safety protocols were in place, including screening questions prior to the visit, additional usage of staff PPE, and extensive cleaning of exam room while observing appropriate contact time as indicated for disinfecting solutions.    HPI Geoffrey Evans presents for a CPX.  He complains of hearing loss over the last decade.  He wants to be referred to audiology to see if he is a candidate for hearing aids.  He also complains of erectile dysfunction.  He is active and denies any recent episodes of chest pain, shortness of breath, palpitations, edema, or fatigue.  Outpatient Medications Prior to Visit  Medication Sig Dispense Refill  . Multiple Vitamins-Minerals (MULTIVITAMIN WITH MINERALS) tablet Take 1 tablet by mouth daily.     No facility-administered medications prior to visit.    ROS Review of Systems  Constitutional: Negative.  Negative for diaphoresis, fatigue and unexpected weight change.  HENT: Positive for hearing loss. Negative for ear discharge, ear pain, facial swelling, postnasal drip, rhinorrhea and sinus pain.   Eyes: Negative.   Respiratory: Positive for apnea. Negative for cough, chest tightness, shortness of breath and wheezing.   Cardiovascular: Negative for chest pain, palpitations and leg swelling.  Gastrointestinal: Negative for abdominal pain, blood in stool, constipation, diarrhea and vomiting.  Endocrine: Negative.   Genitourinary: Negative for difficulty urinating, dysuria, genital sores, penile swelling, scrotal swelling, testicular pain and urgency.       ++ ED  Musculoskeletal: Negative.   Skin: Negative.   Neurological: Negative.  Negative for dizziness, weakness, light-headedness, numbness and headaches.  Hematological: Negative for adenopathy. Does not  bruise/bleed easily.  Psychiatric/Behavioral: Negative.     Objective:  BP 120/76 (BP Location: Left Arm, Patient Position: Sitting, Cuff Size: Large)   Pulse (!) 56   Temp 98.5 F (36.9 C) (Oral)   Ht 5' 10.75" (1.797 m)   Wt 272 lb (123.4 kg)   SpO2 98%   BMI 38.20 kg/m   BP Readings from Last 3 Encounters:  04/16/20 120/76  09/19/17 (!) 95/50  04/13/17 130/64    Wt Readings from Last 3 Encounters:  04/16/20 272 lb (123.4 kg)  09/19/17 284 lb (128.8 kg)  09/01/17 284 lb 6.4 oz (129 kg)    Physical Exam Vitals reviewed.  Constitutional:      Appearance: He is obese.  HENT:     Right Ear: Hearing, tympanic membrane, ear canal and external ear normal. There is no impacted cerumen.     Left Ear: Hearing, tympanic membrane, ear canal and external ear normal. There is no impacted cerumen.     Nose: Nose normal.     Mouth/Throat:     Mouth: Mucous membranes are moist.  Eyes:     General: No scleral icterus.    Conjunctiva/sclera: Conjunctivae normal.  Cardiovascular:     Rate and Rhythm: Normal rate and regular rhythm.     Heart sounds: No murmur.  Pulmonary:     Effort: Pulmonary effort is normal.     Breath sounds: No stridor. No wheezing, rhonchi or rales.  Abdominal:     General: Abdomen is protuberant. Bowel sounds are normal. There is no distension.     Palpations: Abdomen is soft. There is no hepatomegaly, splenomegaly or mass.     Tenderness: There is no abdominal tenderness.  Hernia: There is no hernia in the left inguinal area or right inguinal area.  Genitourinary:    Pubic Area: No rash.      Penis: Normal. No discharge, swelling or lesions.      Testes: Normal.        Right: Mass, tenderness or swelling not present.        Left: Mass, tenderness or swelling not present.     Epididymis:     Right: Normal. Not inflamed or enlarged. No mass or tenderness.     Left: Not inflamed or enlarged. No mass or tenderness.     Prostate: Normal. Not enlarged,  not tender and no nodules present.     Rectum: Normal. Guaiac result negative. No mass, tenderness, anal fissure, external hemorrhoid or internal hemorrhoid. Normal anal tone.  Musculoskeletal:        General: Normal range of motion.     Cervical back: Neck supple.     Right lower leg: No edema.     Left lower leg: No edema.  Lymphadenopathy:     Cervical: No cervical adenopathy.     Lower Body: No right inguinal adenopathy. No left inguinal adenopathy.  Skin:    General: Skin is warm and dry.     Coloration: Skin is not pale.  Neurological:     General: No focal deficit present.     Mental Status: He is alert and oriented to person, place, and time. Mental status is at baseline.  Psychiatric:        Mood and Affect: Mood normal.        Behavior: Behavior normal.        Thought Content: Thought content normal.        Judgment: Judgment normal.     Lab Results  Component Value Date   WBC 6.3 04/13/2017   HGB 14.6 04/13/2017   HCT 43.7 04/13/2017   PLT 221.0 04/13/2017   GLUCOSE 89 04/16/2020   CHOL 183 04/16/2020   TRIG 170.0 (H) 04/16/2020   HDL 32.40 (L) 04/16/2020   LDLDIRECT 110.0 04/13/2017   LDLCALC 117 (H) 04/16/2020   ALT 18 04/16/2020   AST 21 04/16/2020   NA 138 04/16/2020   K 4.3 04/16/2020   CL 102 04/16/2020   CREATININE 1.02 04/16/2020   BUN 13 04/16/2020   CO2 30 04/16/2020   TSH 1.68 04/16/2020   PSA 1.09 04/16/2020   HGBA1C 5.3 04/16/2020    Patient was never admitted.  Assessment & Plan:   Geoffrey Evans was seen today for annual exam.  Diagnoses and all orders for this visit:  Routine general medical examination at a health care facility- Exam completed, labs reviewed - statin therapy is not indicated, vaccines reviewed and updated, colon cancer screening is up-to-date, patient education was given. -     Lipid panel; Future -     PSA; Future -     HIV Antibody (routine testing w rflx); Future -     HIV Antibody (routine testing w rflx) -      PSA -     Lipid panel  Hyperglycemia- His blood sugars are normal. -     Basic metabolic panel; Future -     Hemoglobin A1c; Future -     Hepatic function panel; Future -     Hepatic function panel -     Hemoglobin A1c -     Basic metabolic panel  Bilateral hearing loss, unspecified hearing loss type -  Ambulatory referral to Audiology  Erectile dysfunction due to diseases classified elsewhere- Work-up for secondary causes is negative.  I recommended that he try a PDE 5 inhibitor. -     TSH; Future -     Testosterone Total,Free,Bio, Males; Future -     Prolactin; Future -     vardenafil (LEVITRA) 10 MG tablet; Take 1 tablet (10 mg total) by mouth daily as needed for erectile dysfunction. -     Prolactin -     Testosterone Total,Free,Bio, Males -     TSH   I am having Geoffrey Evans. Geoffrey Evans start on vardenafil. I am also having him maintain his multivitamin with minerals.  Meds ordered this encounter  Medications  . vardenafil (LEVITRA) 10 MG tablet    Sig: Take 1 tablet (10 mg total) by mouth daily as needed for erectile dysfunction.    Dispense:  10 tablet    Refill:  5     Follow-up: Return in about 6 months (around 10/16/2020).  Geoffrey Calico, MD

## 2020-04-16 NOTE — Patient Instructions (Signed)

## 2020-04-17 ENCOUNTER — Encounter: Payer: Self-pay | Admitting: Internal Medicine

## 2020-04-17 LAB — TESTOSTERONE TOTAL,FREE,BIO, MALES
Albumin: 4.3 g/dL (ref 3.6–5.1)
Sex Hormone Binding: 35 nmol/L (ref 22–77)
Testosterone, Bioavailable: 161.7 ng/dL (ref >=110.0)
Testosterone, Free: 82.1 pg/mL (ref 46.0–224.0)
Testosterone: 609 ng/dL (ref 250–827)

## 2020-04-17 LAB — HIV ANTIBODY (ROUTINE TESTING W REFLEX): HIV 1&2 Ab, 4th Generation: NONREACTIVE

## 2020-04-17 LAB — PROLACTIN: Prolactin: 6 ng/mL (ref 2.0–18.0)

## 2020-04-20 ENCOUNTER — Encounter: Payer: Self-pay | Admitting: Internal Medicine

## 2020-08-18 ENCOUNTER — Other Ambulatory Visit: Payer: Self-pay

## 2020-08-18 ENCOUNTER — Ambulatory Visit: Payer: 59 | Admitting: Internal Medicine

## 2020-08-18 ENCOUNTER — Encounter: Payer: Self-pay | Admitting: Internal Medicine

## 2020-08-18 VITALS — BP 130/70 | HR 61 | Temp 98.8°F | Resp 16 | Ht 70.75 in | Wt 278.0 lb

## 2020-08-18 DIAGNOSIS — S86819A Strain of other muscle(s) and tendon(s) at lower leg level, unspecified leg, initial encounter: Secondary | ICD-10-CM | POA: Insufficient documentation

## 2020-08-18 DIAGNOSIS — S86812A Strain of other muscle(s) and tendon(s) at lower leg level, left leg, initial encounter: Secondary | ICD-10-CM

## 2020-08-18 MED ORDER — ETODOLAC ER 500 MG PO TB24: 500.0000 mg | ORAL_TABLET | Freq: Every day | ORAL | 0 refills | Status: DC

## 2020-08-18 NOTE — Progress Notes (Signed)
Subjective:  Patient ID: Geoffrey Evans, male    DOB: 05-May-1961  Age: 59 y.o. MRN: [id number]  CC: Leg Pain  This visit occurred during the SARS-CoV-2 public health emergency.  Safety protocols were in place, including screening questions prior to the visit, additional usage of staff PPE, and extensive cleaning of exam room while observing appropriate contact time as indicated for disinfecting solutions.    HPI Geoffrey Evans presents for the complaint of a 2 month hx pain over his left posterior calf. He denies any specific trauma/injury. He has not taken anything for the pain. He denies claudication.  He takes about 10,000 steps a day and does not feel discomfort until he is walking long distances.  The overlying skin is normal and has not noticed any swelling.  Outpatient Medications Prior to Visit  Medication Sig Dispense Refill  . Multiple Vitamins-Minerals (MULTIVITAMIN WITH MINERALS) tablet Take 1 tablet by mouth daily.    . vardenafil (LEVITRA) 10 MG tablet Take 1 tablet (10 mg total) by mouth daily as needed for erectile dysfunction. 10 tablet 5   No facility-administered medications prior to visit.    ROS Review of Systems  Constitutional: Negative.  Negative for chills, diaphoresis, fatigue and unexpected weight change.  HENT: Negative.   Eyes: Negative for visual disturbance.  Respiratory: Negative for chest tightness, shortness of breath and wheezing.   Cardiovascular: Negative for chest pain, palpitations and leg swelling.  Gastrointestinal: Negative for abdominal pain.  Endocrine: Negative.   Genitourinary: Negative.  Negative for difficulty urinating.  Musculoskeletal: Positive for arthralgias. Negative for myalgias.  Skin: Negative.  Negative for color change and rash.  Neurological: Negative.  Negative for dizziness, weakness, light-headedness and numbness.  Hematological: Negative for adenopathy. Does not bruise/bleed easily.  Psychiatric/Behavioral:  Negative.     Objective:  BP 130/70 (BP Location: Left Arm, Patient Position: Sitting, Cuff Size: Normal)   Pulse 61   Temp 98.8 F (37.1 C) (Oral)   Resp 16   Ht 5' 10.75" (1.797 m)   Wt 278 lb (126.1 kg)   SpO2 98%   BMI 39.05 kg/m   BP Readings from Last 3 Encounters:  08/18/20 130/70  04/16/20 120/76  09/19/17 (!) 95/50    Wt Readings from Last 3 Encounters:  08/18/20 278 lb (126.1 kg)  04/16/20 272 lb (123.4 kg)  09/19/17 284 lb (128.8 kg)    Physical Exam Vitals reviewed.  Constitutional:      Appearance: He is obese.  HENT:     Nose: Nose normal.     Mouth/Throat:     Mouth: Mucous membranes are moist.  Eyes:     General: No scleral icterus.    Conjunctiva/sclera: Conjunctivae normal.  Cardiovascular:     Rate and Rhythm: Normal rate and regular rhythm.     Heart sounds: No murmur heard.   Pulmonary:     Effort: Pulmonary effort is normal.     Breath sounds: No stridor. No wheezing, rhonchi or rales.  Abdominal:     General: Abdomen is protuberant. Bowel sounds are normal. There is no distension.     Palpations: Abdomen is soft. There is no hepatomegaly, splenomegaly or mass.  Musculoskeletal:     Cervical back: Neck supple.     Right lower leg: Normal.     Left lower leg: No swelling, deformity, tenderness or bony tenderness. No edema.     Right ankle: Normal.     Right Achilles Tendon: Normal.  Left ankle: Normal. No swelling or deformity. Normal range of motion. Anterior drawer test negative.     Left Achilles Tendon: No tenderness or defects. Thompson's test negative.  Lymphadenopathy:     Cervical: No cervical adenopathy.  Neurological:     Mental Status: He is alert.     Lab Results  Component Value Date   WBC 6.3 04/13/2017   HGB 14.6 04/13/2017   HCT 43.7 04/13/2017   PLT 221.0 04/13/2017   GLUCOSE 89 04/16/2020   CHOL 183 04/16/2020   TRIG 170.0 (H) 04/16/2020   HDL 32.40 (L) 04/16/2020   LDLDIRECT 110.0 04/13/2017   LDLCALC  117 (H) 04/16/2020   ALT 18 04/16/2020   AST 21 04/16/2020   NA 138 04/16/2020   K 4.3 04/16/2020   CL 102 04/16/2020   CREATININE 1.02 04/16/2020   BUN 13 04/16/2020   CO2 30 04/16/2020   TSH 1.68 04/16/2020   PSA 1.09 04/16/2020   HGBA1C 5.3 04/16/2020    Patient was never admitted.  Assessment & Plan:   [name] was seen today for leg pain.  Diagnoses and all orders for this visit:  Strain of calf muscle, left, initial encounter- I recommended rest, warm compresses, and to start taking an anti-inflammatory.  We discussed doing an MRI of the area to see if there has been a tear in the gastrocnemius or the development of the tumor but he deferred on that at this time.  Will recheck him in 3 to 4 weeks. -     etodolac (LODINE XL) 500 MG 24 hr tablet; Take 1 tablet (500 mg total) by mouth daily.   I am having [name] start on etodolac. I am also having him maintain his multivitamin with minerals and vardenafil.  Meds ordered this encounter  Medications  . etodolac (LODINE XL) 500 MG 24 hr tablet    Sig: Take 1 tablet (500 mg total) by mouth daily.    Dispense:  30 tablet    Refill:  0     Follow-up: Return in about 4 weeks (around 09/15/2020).  Scarlette Calico, MD

## 2020-08-18 NOTE — Patient Instructions (Signed)
Muscle Strain  A muscle strain is an injury that occurs when a muscle is stretched beyond its normal length. Usually, a small number of muscle fibers are torn when this happens. There are three types of muscle strains. First-degree strains have the least amount of muscle fiber tearing and the least amount of pain. Second-degree and third-degree strains have more tearing and pain.  Usually, recovery from muscle strain takes 1-2 weeks. Complete healing normally takes 5-6 weeks.  What are the causes?  This condition is caused when a sudden, violent force is placed on a muscle and stretches it too far. This may occur with a fall, lifting, or sports.  What increases the risk?  This condition is more likely to develop in athletes and people who are physically active.  What are the signs or symptoms?  Symptoms of this condition include:  · Pain.  · Bruising.  · Swelling.  · Trouble using the muscle.  How is this diagnosed?  This condition is diagnosed based on a physical exam and your medical history. Tests may also be done, including an X-ray, ultrasound, or MRI.  How is this treated?  This condition is initially treated with PRICE therapy. This therapy involves:  · Protecting the muscle from being injured again.  · Resting the injured muscle.  · Icing the injured muscle.  · Applying pressure (compression) to the injured muscle. This may be done with a splint or elastic bandage.  · Raising (elevating) the injured muscle.  Your health care provider may also recommend medicine for pain.  Follow these instructions at home:  If you have a splint:  · Wear the splint as told by your health care provider. Remove it only as told by your health care provider.  · Loosen the splint if your fingers or toes tingle, become numb, or turn cold and blue.  · Keep the splint clean.  · If the splint is not waterproof:  ? Do not let it get wet.  ? Cover it with a watertight covering when you take a bath or a shower.  Managing pain, stiffness,  and swelling    · If directed, put ice on the injured area.  ? If you have a removable splint, remove it as told by your health care provider.  ? Put ice in a plastic bag.  ? Place a towel between your skin and the bag.  ? Leave the ice on for 20 minutes, 2-3 times a day.  · Move your fingers or toes often to avoid stiffness and to lessen swelling.  · Raise (elevate) the injured area above the level of your heart while you are sitting or lying down.  · Wear an elastic bandage as told by your health care provider. Make sure that it is not too tight.  General instructions  · Take over-the-counter and prescription medicines only as told by your health care provider.  · Restrict your activity and rest the injured muscle as told by your health care provider. Gentle movements may be allowed.  · If physical therapy was prescribed, do exercises as told by your health care provider.  · Do not put pressure on any part of the splint until it is fully hardened. This may take several hours.  · Do not use any products that contain nicotine or tobacco, such as cigarettes and e-cigarettes. These can delay bone healing. If you need help quitting, ask your health care provider.  · Ask your health care provider when it   You have more pain or swelling in the injured area. Get help right away if:  You have numbness or tingling or lose a lot of strength in the injured area. Summary  A muscle strain is an injury that occurs when a muscle is stretched beyond its normal length.  This condition is caused when a sudden, violent force is placed on a muscle and stretches it too far.  This condition is initially treated with PRICE therapy, which involves protecting, resting,  icing, compressing, and elevating.  Gentle movements may be allowed. If physical therapy was prescribed, do exercises as told by your health care provider. This information is not intended to replace advice given to you by your health care provider. Make sure you discuss any questions you have with your health care provider. Document Revised: 11/24/2017 Document Reviewed: 01/18/2017 Elsevier Patient Education  2020 [employer]  

## 2021-07-17 ENCOUNTER — Other Ambulatory Visit: Payer: Self-pay | Admitting: Internal Medicine

## 2021-07-17 DIAGNOSIS — N521 Erectile dysfunction due to diseases classified elsewhere: Secondary | ICD-10-CM

## 2021-08-22 ENCOUNTER — Emergency Department (HOSPITAL_BASED_OUTPATIENT_CLINIC_OR_DEPARTMENT_OTHER): Payer: 59

## 2021-08-22 ENCOUNTER — Emergency Department (HOSPITAL_BASED_OUTPATIENT_CLINIC_OR_DEPARTMENT_OTHER)
Admission: EM | Admit: 2021-08-22 | Discharge: 2021-08-23 | Disposition: A | Discharge status: Home / Self Care | Payer: 59 | Attending: Emergency Medicine | Admitting: Emergency Medicine

## 2021-08-22 ENCOUNTER — Other Ambulatory Visit: Payer: Self-pay

## 2021-08-22 ENCOUNTER — Encounter (HOSPITAL_BASED_OUTPATIENT_CLINIC_OR_DEPARTMENT_OTHER): Payer: Self-pay | Admitting: Obstetrics and Gynecology

## 2021-08-22 DIAGNOSIS — R059 Cough, unspecified: Secondary | ICD-10-CM | POA: Diagnosis present

## 2021-08-22 DIAGNOSIS — Z87891 Personal history of nicotine dependence: Secondary | ICD-10-CM | POA: Insufficient documentation

## 2021-08-22 DIAGNOSIS — U071 COVID-19: Secondary | ICD-10-CM | POA: Diagnosis not present

## 2021-08-22 NOTE — ED Triage Notes (Signed)
Patient was sent to the ER by his PCP to get a chest x-ray to r/o pneumonia due to hime being COVID+

## 2021-08-23 ENCOUNTER — Telehealth: Payer: Self-pay | Admitting: Internal Medicine

## 2021-08-23 LAB — BASIC METABOLIC PANEL
Anion gap: 9 (ref 5–15)
BUN: 13 mg/dL (ref 6–20)
CO2: 28 mmol/L (ref 22–32)
Calcium: 9.5 mg/dL (ref 8.9–10.3)
Chloride: 101 mmol/L (ref 98–111)
Creatinine, Ser: 0.97 mg/dL (ref 0.61–1.24)
GFR, Estimated: >60 mL/min (ref >60)
Glucose, Bld: 104 mg/dL — ABNORMAL HIGH (ref 70–99)
Potassium: 4.2 mmol/L (ref 3.5–5.1)
Sodium: 138 mmol/L (ref 135–145)

## 2021-08-23 MED ORDER — NIRMATRELVIR/RITONAVIR (PAXLOVID)TABLET
3.0000 | ORAL_TABLET | Freq: Two times a day (BID) | ORAL | 0 refills | Status: DC
Start: 2021-08-23 — End: 2021-08-23

## 2021-08-23 MED ORDER — NIRMATRELVIR/RITONAVIR (PAXLOVID)TABLET: 3.0000 | ORAL_TABLET | Freq: Two times a day (BID) | ORAL | 0 refills | Status: AC

## 2021-08-23 NOTE — ED Provider Notes (Signed)
[facility] EMERGENCY DEPT Provider Note   CSN: [id number] Arrival date & time: 08/22/21  2228     History Chief Complaint  Patient presents with   Covid Positive    Geoffrey Evans is a 60 y.o. male.  HPI     This is a 60 year old male with a history of obstructive sleep apnea, seasonal allergies, obesity who presents COVID-positive.  Patient reports he began to have some symptoms on Friday.  Symptoms include chills, body aches, cough, chest tightness with coughing.  No significant shortness of breath.  He had a test done at [facility] yesterday and was called and told it was positive Sunday evening.  He called his primary physician to request Paxlovid.  During the screening, he was told to be evaluated in the ER because of the chest tightness he was having with coughing.  He states that he has had some intermittent chest tightness mostly with coughing.  No shortness of breath.  No chest pain.  He is currently asymptomatic.  Denies high blood pressure, high cholesterol, history of smoking.  Has had some hyperglycemia in the past but no official diagnosis of diabetes.  Past Medical History:  Diagnosis Date   OSA (obstructive sleep apnea) 2010   Seasonal allergies     Patient Active Problem List   Diagnosis Date Noted   Strain of calf muscle 08/18/2020   Bilateral hearing loss 04/16/2020   Hyperglycemia 04/16/2020   Erectile dysfunction due to diseases classified elsewhere 04/16/2020   Colon polyps 04/13/2017   Routine general medical examination at a health care facility 12/07/2015   OSA (obstructive sleep apnea) 12/07/2015    Past Surgical History:  Procedure Laterality Date   COLONOSCOPY  04/2014       Family History  Problem Relation Age of Onset   Heart failure Father    Diabetes Brother    Cancer Neg Hx    Alcohol abuse Neg Hx    Early death Neg Hx    Hyperlipidemia Neg Hx    Hypertension Neg Hx    Kidney disease Neg Hx    Learning disabilities  Neg Hx    Stroke Neg Hx    COPD Neg Hx    Depression Neg Hx    Colon cancer Neg Hx    Esophageal cancer Neg Hx    Rectal cancer Neg Hx    Stomach cancer Neg Hx     Social History   Tobacco Use   Smoking status: Never   Smokeless tobacco: Former    Types: Chew    Quit date: 03/23/1990  Vaping Use   Vaping Use: Never used  Substance Use Topics   Alcohol use: No    Alcohol/week: 0.0 standard drinks    Comment: rare   Drug use: No    Home Medications Prior to Admission medications   Medication Sig Start Date End Date Taking? Authorizing Provider  etodolac (LODINE XL) 500 MG 24 hr tablet Take 1 tablet (500 mg total) by mouth daily. 08/18/20   Janith Lima, MD  Multiple Vitamins-Minerals (MULTIVITAMIN WITH MINERALS) tablet Take 1 tablet by mouth daily.    [provider]  nirmatrelvir/ritonavir EUA (PAXLOVID) 20 x 150 MG & 10 x '100MG'$  TABS Take 3 tablets by mouth 2 (two) times daily for 5 days. Patient GFR is >60. Take nirmatrelvir (150 mg) two tablets twice daily for 5 days and ritonavir (100 mg) one tablet twice daily for 5 days. 08/23/21 08/28/21  Vali Capano, Barbette Hair, MD  vardenafil (LEVITRA) 10 MG tablet Take 1 tablet (10 mg total) by mouth daily as needed for erectile dysfunction. 04/16/20   Janith Lima, MD    Allergies    Patient has no known allergies.  Review of Systems   Review of Systems  Constitutional:  Positive for chills. Negative for fever.  Respiratory:  Positive for cough and chest tightness. Negative for shortness of breath and wheezing.   Cardiovascular:  Negative for chest pain and leg swelling.  Gastrointestinal:  Negative for abdominal pain.  Genitourinary:  Negative for dysuria.  All other systems reviewed and are negative.  Physical Exam Updated Vital Signs BP 140/89   Pulse 74   Temp 98.7 F (37.1 C)   Resp 19   Ht 1.803 m ('5\' 11"'$ )   Wt 124.7 kg   SpO2 96%   BMI 38.35 kg/m   Physical Exam Vitals and nursing note reviewed.   Constitutional:      Appearance: He is well-developed. He is obese. He is not ill-appearing.  HENT:     Head: Normocephalic and atraumatic.     Nose: Nose normal.     Mouth/Throat:     Mouth: Mucous membranes are moist.  Eyes:     Pupils: Pupils are equal, round, and reactive to light.  Cardiovascular:     Rate and Rhythm: Normal rate and regular rhythm.     Heart sounds: Normal heart sounds. No murmur heard. Pulmonary:     Effort: Pulmonary effort is normal. No respiratory distress.     Breath sounds: Normal breath sounds. No wheezing.  Abdominal:     Palpations: Abdomen is soft.     Tenderness: There is no abdominal tenderness. There is no rebound.  Musculoskeletal:     Right lower leg: No edema.     Left lower leg: No edema.  Skin:    General: Skin is warm and dry.  Neurological:     Mental Status: He is alert and oriented to person, place, and time.  Psychiatric:        Mood and Affect: Mood normal.    ED Results / Procedures / Treatments   Labs (all labs ordered are listed, but only abnormal results are displayed) Labs Reviewed  BASIC METABOLIC PANEL - Abnormal; Notable for the following components:      Result Value   Glucose, Bld 104 (*)    All other components within normal limits    EKG EKG Interpretation  Date/Time:  Monday August 23 2021 00:07:44 EDT Ventricular Rate:  65 PR Interval:  162 QRS Duration: 78 QT Interval:  384 QTC Calculation: 399 R Axis:   81 Text Interpretation: Normal sinus rhythm Normal ECG Confirmed by Thayer Jew [phone] on 08/23/2021 12:10:23 AM  Radiology DG Chest Portable 1 View  Result Date: 08/22/2021 CLINICAL DATA:  COVID positive with cough. EXAM: PORTABLE CHEST 1 VIEW COMPARISON:  None. FINDINGS: There is no evidence of acute infiltrate, pleural effusion or pneumothorax. The heart size and mediastinal contours are within normal limits. The visualized skeletal structures are unremarkable. IMPRESSION: No active  cardiopulmonary disease. Electronically Signed   By: Virgina Norfolk M.D.   On: 08/22/2021 23:50    Procedures Procedures   Medications Ordered in ED Medications - No data to display  ED Course  I have reviewed the triage vital signs and the nursing notes.  Pertinent labs & imaging results that were available during my care of the patient were reviewed by me and considered in my  medical decision making (see chart for details).    MDM Rules/Calculators/A&P                           Patient presents at the request of his primary office for evaluation.  Tested COVID-positive.  Has very typical COVID symptoms but did endorse some chest tightness with coughing.  He is currently asymptomatic.  Highly suspect all of his symptoms are related to his COVID-19.  However, screening EKG and chest x-ray were obtained.  EKG is completely normal.  Age and obesity are his primary risk factors for any ACS but I highly doubt this in the setting of his acute illness.  Do not feel he needs further work-up at this time as his pain is atypical and associated with coughing.  Chest x-ray shows no evidence of pneumothorax or pneumonia.  I did obtain a BMP to establish his creatinine clearance in order to prescribe him Paxlovid.  Patient is agreeable to plan.  After history, exam, and medical workup I feel the patient has been appropriately medically screened and is safe for discharge home. Pertinent diagnoses were discussed with the patient. Patient was given return precautions.  Final Clinical Impression(s) / ED Diagnoses Final diagnoses:  COVID-19    Rx / DC Orders ED Discharge Orders          Ordered    nirmatrelvir/ritonavir EUA (PAXLOVID) 20 x 150 MG & 10 x '100MG'$  TABS  2 times daily,   Status:  Discontinued        08/23/21 0059    nirmatrelvir/ritonavir EUA (PAXLOVID) 20 x 150 MG & 10 x '100MG'$  TABS  2 times daily        08/23/21 0101             Chanah Tidmore, Barbette Hair, MD 08/23/21 0107

## 2021-08-23 NOTE — Discharge Instructions (Addendum)
You are seen today after becoming COVID-positive.  Your chest x-ray and EKG are reassuring.  You are given a prescription for Paxlovid.  There is drug interaction with Levitra.  Do not take this medication while taking Paxlovid.  If you have any new or worsening symptoms you should be re-evaluated.

## 2021-08-23 NOTE — Telephone Encounter (Signed)
[facility] FYI  Caller states he is covid positive and would like Paxlovid. Caller has nasal congestion, cough, headache, and inability to sleep.,Symptoms started Friday.  Advised to Go to ED Now (or PCP triage). Patient understood and chose to go to [facility] off of [address].

## 2021-08-26 ENCOUNTER — Encounter: Payer: 59 | Admitting: Internal Medicine

## 2021-09-22 ENCOUNTER — Other Ambulatory Visit: Payer: Self-pay

## 2021-09-23 ENCOUNTER — Ambulatory Visit (INDEPENDENT_AMBULATORY_CARE_PROVIDER_SITE_OTHER): Payer: 59 | Admitting: Internal Medicine

## 2021-09-23 ENCOUNTER — Encounter: Payer: Self-pay | Admitting: Internal Medicine

## 2021-09-23 VITALS — BP 126/78 | HR 59 | Temp 97.7°F | Ht 71.0 in | Wt 273.0 lb

## 2021-09-23 DIAGNOSIS — N521 Erectile dysfunction due to diseases classified elsewhere: Secondary | ICD-10-CM

## 2021-09-23 DIAGNOSIS — Z23 Encounter for immunization: Secondary | ICD-10-CM

## 2021-09-23 DIAGNOSIS — Z125 Encounter for screening for malignant neoplasm of prostate: Secondary | ICD-10-CM

## 2021-09-23 DIAGNOSIS — R739 Hyperglycemia, unspecified: Secondary | ICD-10-CM

## 2021-09-23 DIAGNOSIS — Z0000 Encounter for general adult medical examination without abnormal findings: Secondary | ICD-10-CM | POA: Diagnosis not present

## 2021-09-23 LAB — BASIC METABOLIC PANEL
BUN: 20 mg/dL (ref 6–23)
CO2: 25 mEq/L (ref 19–32)
Calcium: 9.5 mg/dL (ref 8.4–10.5)
Chloride: 103 mEq/L (ref 96–112)
Creatinine, Ser: 1.01 mg/dL (ref 0.40–1.50)
GFR: 81.71 mL/min (ref >60.00)
Glucose, Bld: 82 mg/dL (ref 70–99)
Potassium: 4 mEq/L (ref 3.5–5.1)
Sodium: 138 mEq/L (ref 135–145)

## 2021-09-23 LAB — LIPID PANEL
Cholesterol: 183 mg/dL (ref 0–200)
HDL: 31.5 mg/dL — ABNORMAL LOW (ref >39.00)
NonHDL: 151.46
Total CHOL/HDL Ratio: 6
Triglycerides: 307 mg/dL — ABNORMAL HIGH (ref 0.0–149.0)
VLDL: 61.4 mg/dL — ABNORMAL HIGH (ref 0.0–40.0)

## 2021-09-23 LAB — LDL CHOLESTEROL, DIRECT: Direct LDL: 116 mg/dL

## 2021-09-23 LAB — HEMOGLOBIN A1C: Hgb A1c MFr Bld: 5.6 % (ref 4.6–6.5)

## 2021-09-23 LAB — PSA: PSA: 1.5 ng/mL (ref 0.10–4.00)

## 2021-09-23 MED ORDER — VARDENAFIL HCL 10 MG PO TABS: 10.0000 mg | ORAL_TABLET | Freq: Every day | ORAL | 5 refills | Status: DC | PRN

## 2021-09-23 NOTE — Patient Instructions (Signed)
Health Maintenance, Male Adopting a healthy lifestyle and getting preventive care are important in promoting health and wellness. Ask your health care provider about: The right schedule for you to have regular tests and exams. Things you can do on your own to prevent diseases and keep yourself healthy. What should I know about diet, weight, and exercise? Eat a healthy diet  Eat a diet that includes plenty of vegetables, fruits, low-fat dairy products, and lean protein. Do not eat a lot of foods that are high in solid fats, added sugars, or sodium. Maintain a healthy weight Body mass index (BMI) is a measurement that can be used to identify possible weight problems. It estimates body fat based on height and weight. Your health care provider can help determine your BMI and help you achieve or maintain a healthy weight. Get regular exercise Get regular exercise. This is one of the most important things you can do for your health. Most adults should: Exercise for at least 150 minutes each week. The exercise should increase your heart rate and make you sweat (moderate-intensity exercise). Do strengthening exercises at least twice a week. This is in addition to the moderate-intensity exercise. Spend less time sitting. Even light physical activity can be beneficial. Watch cholesterol and blood lipids Have your blood tested for lipids and cholesterol at 59 years of age, then have this test every 5 years. You may need to have your cholesterol levels checked more often if: Your lipid or cholesterol levels are high. You are older than 59 years of age. You are at high risk for heart disease. What should I know about cancer screening? Many types of cancers can be detected early and may often be prevented. Depending on your health history and family history, you may need to have cancer screening at various ages. This may include screening for: Colorectal cancer. Prostate cancer. Skin cancer. Lung  cancer. What should I know about heart disease, diabetes, and high blood pressure? Blood pressure and heart disease High blood pressure causes heart disease and increases the risk of stroke. This is more likely to develop in people who have high blood pressure readings, are of African descent, or are overweight. Talk with your health care provider about your target blood pressure readings. Have your blood pressure checked: Every 3-5 years if you are 18-39 years of age. Every year if you are 40 years old or older. If you are between the ages of 65 and 75 and are a current or former smoker, ask your health care provider if you should have a one-time screening for abdominal aortic aneurysm (AAA). Diabetes Have regular diabetes screenings. This checks your fasting blood sugar level. Have the screening done: Once every three years after age 45 if you are at a normal weight and have a low risk for diabetes. More often and at a younger age if you are overweight or have a high risk for diabetes. What should I know about preventing infection? Hepatitis B If you have a higher risk for hepatitis B, you should be screened for this virus. Talk with your health care provider to find out if you are at risk for hepatitis B infection. Hepatitis C Blood testing is recommended for: Everyone born from 1945 through 1965. Anyone with known risk factors for hepatitis C. Sexually transmitted infections (STIs) You should be screened each year for STIs, including gonorrhea and chlamydia, if: You are sexually active and are younger than 59 years of age. You are older than 59 years   of age and your health care provider tells you that you are at risk for this type of infection. Your sexual activity has changed since you were last screened, and you are at increased risk for chlamydia or gonorrhea. Ask your health care provider if you are at risk. Ask your health care provider about whether you are at high risk for HIV.  Your health care provider may recommend a prescription medicine to help prevent HIV infection. If you choose to take medicine to prevent HIV, you should first get tested for HIV. You should then be tested every 3 months for as long as you are taking the medicine. Follow these instructions at home: Lifestyle Do not use any products that contain nicotine or tobacco, such as cigarettes, e-cigarettes, and chewing tobacco. If you need help quitting, ask your health care provider. Do not use street drugs. Do not share needles. Ask your health care provider for help if you need support or information about quitting drugs. Alcohol use Do not drink alcohol if your health care provider tells you not to drink. If you drink alcohol: Limit how much you have to 0-2 drinks a day. Be aware of how much alcohol is in your drink. In the [place] one drink equals one 12 oz bottle of beer (355 mL), one 5 oz glass of wine (148 mL), or one 1 oz glass of hard liquor (44 mL). General instructions Schedule regular health, dental, and eye exams. Stay current with your vaccines. Tell your health care provider if: You often feel depressed. You have ever been abused or do not feel safe at home. Summary Adopting a healthy lifestyle and getting preventive care are important in promoting health and wellness. Follow your health care provider's instructions about healthy diet, exercising, and getting tested or screened for diseases. Follow your health care provider's instructions on monitoring your cholesterol and blood pressure. This information is not intended to replace advice given to you by your health care provider. Make sure you discuss any questions you have with your health care provider. Document Revised: 02/19/2021 Document Reviewed: 12/05/2018 Elsevier Patient Education  2022 Elsevier Inc.  

## 2021-09-23 NOTE — Progress Notes (Signed)
Subjective:  Patient ID: Geoffrey Evans, male    DOB: 1961/07/22  Age: 60 y.o. MRN: [id number]  CC: Annual Exam  This visit occurred during the SARS-CoV-2 public health emergency.  Safety protocols were in place, including screening questions prior to the visit, additional usage of staff PPE, and extensive cleaning of exam room while observing appropriate contact time as indicated for disinfecting solutions.    HPI Geoffrey Evans presents for a CPX.  He walks about 10,000 steps a day.  He does not experience chest pain, shortness of breath, diaphoresis, dizziness, lightheadedness, or edema.  Outpatient Medications Prior to Visit  Medication Sig Dispense Refill   etodolac (LODINE XL) 500 MG 24 hr tablet Take 1 tablet (500 mg total) by mouth daily. 30 tablet 0   Multiple Vitamins-Minerals (MULTIVITAMIN WITH MINERALS) tablet Take 1 tablet by mouth daily.     vardenafil (LEVITRA) 10 MG tablet Take 1 tablet (10 mg total) by mouth daily as needed for erectile dysfunction. 10 tablet 5   No facility-administered medications prior to visit.    ROS Review of Systems  Constitutional:  Negative for diaphoresis and fatigue.  HENT: Negative.    Eyes: Negative.   Respiratory:  Negative for cough, chest tightness, shortness of breath and wheezing.   Cardiovascular:  Negative for chest pain, palpitations and leg swelling.  Gastrointestinal:  Negative for abdominal pain, constipation, diarrhea and nausea.  Endocrine: Negative.   Genitourinary: Negative.  Negative for difficulty urinating, hematuria, scrotal swelling and testicular pain.       ++ED  Musculoskeletal: Negative.  Negative for arthralgias and myalgias.  Skin: Negative.   Neurological:  Negative for dizziness and weakness.  Hematological:  Negative for adenopathy. Does not bruise/bleed easily.  Psychiatric/Behavioral: Negative.     Objective:  BP 126/78 (BP Location: Right Arm, Patient Position: Sitting, Cuff Size: Large)    Pulse (!) 59   Temp 97.7 F (36.5 C) (Oral)   Ht 5\' 11"  (1.803 m)   Wt 273 lb (123.8 kg)   SpO2 97%   BMI 38.08 kg/m   BP Readings from Last 3 Encounters:  09/23/21 126/78  08/23/21 126/81  08/18/20 130/70    Wt Readings from Last 3 Encounters:  09/23/21 273 lb (123.8 kg)  08/22/21 275 lb (124.7 kg)  08/18/20 278 lb (126.1 kg)    Physical Exam Vitals reviewed.  HENT:     Nose: Nose normal.     Mouth/Throat:     Mouth: Mucous membranes are moist.  Eyes:     Conjunctiva/sclera: Conjunctivae normal.  Cardiovascular:     Rate and Rhythm: Normal rate and regular rhythm.     Heart sounds: No murmur heard. Pulmonary:     Effort: Pulmonary effort is normal.     Breath sounds: No stridor. No wheezing, rhonchi or rales.  Abdominal:     General: Abdomen is protuberant. Bowel sounds are normal. There is no distension.     Palpations: Abdomen is soft. There is no hepatomegaly, splenomegaly or mass.     Tenderness: There is no abdominal tenderness. There is no guarding or rebound.     Hernia: There is no hernia in the left inguinal area or right inguinal area.  Genitourinary:    Pubic Area: No rash.      Penis: Normal and circumcised.      Testes: Normal.     Epididymis:     Right: Normal.     Left: Normal.     Prostate:  Normal. Not enlarged, not tender and no nodules present.     Rectum: Normal. Guaiac result negative. No mass, tenderness, anal fissure, external hemorrhoid or internal hemorrhoid. Normal anal tone.  Musculoskeletal:        General: Normal range of motion.     Cervical back: Neck supple.     Right lower leg: No edema.  Lymphadenopathy:     Cervical: No cervical adenopathy.     Lower Body: No right inguinal adenopathy. No left inguinal adenopathy.  Skin:    General: Skin is warm and dry.  Neurological:     General: No focal deficit present.     Mental Status: He is alert.  Psychiatric:        Mood and Affect: Mood normal.    Lab Results  Component  Value Date   WBC 6.3 04/13/2017   HGB 14.6 04/13/2017   HCT 43.7 04/13/2017   PLT 221.0 04/13/2017   GLUCOSE 82 09/23/2021   CHOL 183 09/23/2021   TRIG 307.0 (H) 09/23/2021   HDL 31.50 (L) 09/23/2021   LDLDIRECT 116.0 09/23/2021   LDLCALC 117 (H) 04/16/2020   ALT 18 04/16/2020   AST 21 04/16/2020   NA 138 09/23/2021   K 4.0 09/23/2021   CL 103 09/23/2021   CREATININE 1.01 09/23/2021   BUN 20 09/23/2021   CO2 25 09/23/2021   TSH 1.68 04/16/2020   PSA 1.50 09/23/2021   HGBA1C 5.6 09/23/2021    DG Chest Portable 1 View  Result Date: 08/22/2021 CLINICAL DATA:  COVID positive with cough. EXAM: PORTABLE CHEST 1 VIEW COMPARISON:  None. FINDINGS: There is no evidence of acute infiltrate, pleural effusion or pneumothorax. The heart size and mediastinal contours are within normal limits. The visualized skeletal structures are unremarkable. IMPRESSION: No active cardiopulmonary disease. Electronically Signed   By: Virgina Norfolk M.D.   On: 08/22/2021 23:50    Assessment & Plan:   Geoffrey Evans was seen today for annual exam.  Diagnoses and all orders for this visit:  Hyperglycemia- His A1c is normal. -     Basic metabolic panel; Future -     Hemoglobin A1c; Future -     Hemoglobin A1c -     Basic metabolic panel  Routine general medical examination at a health care facility- Exam completed, labs reviewed, vaccines reviewed and updated, cancer screenings are up-to-date, patient education was given. -     PSA; Future -     Lipid panel; Future -     Lipid panel -     PSA  Erectile dysfunction due to diseases classified elsewhere -     vardenafil (LEVITRA) 10 MG tablet; Take 1 tablet (10 mg total) by mouth daily as needed for erectile dysfunction.  Other orders -     Flu Vaccine QUAD 6+ mos PF IM (Fluarix Quad PF) -     LDL cholesterol, direct  I am having Geoffrey Evans. Geoffrey Evans maintain his multivitamin with minerals, etodolac, and vardenafil.  Meds ordered this encounter   Medications   vardenafil (LEVITRA) 10 MG tablet    Sig: Take 1 tablet (10 mg total) by mouth daily as needed for erectile dysfunction.    Dispense:  10 tablet    Refill:  5     Follow-up: Return in about 1 year (around 09/23/2022).  Scarlette Calico, MD

## 2021-10-08 ENCOUNTER — Other Ambulatory Visit: Payer: Self-pay

## 2021-10-08 ENCOUNTER — Ambulatory Visit: Payer: 59 | Admitting: Nurse Practitioner

## 2021-10-08 ENCOUNTER — Encounter: Payer: Self-pay | Admitting: Nurse Practitioner

## 2021-10-08 VITALS — BP 118/62 | HR 71 | Temp 98.1°F | Resp 20 | Ht 71.0 in | Wt 278.1 lb

## 2021-10-08 DIAGNOSIS — L239 Allergic contact dermatitis, unspecified cause: Secondary | ICD-10-CM | POA: Insufficient documentation

## 2021-10-08 MED ORDER — METHYLPREDNISOLONE ACETATE 40 MG/ML IJ SUSP: 40.0000 mg | Freq: Once | INTRAMUSCULAR | Status: DC

## 2021-10-08 MED ORDER — LEVOCETIRIZINE DIHYDROCHLORIDE 5 MG PO TABS: 5.0000 mg | ORAL_TABLET | Freq: Every evening | ORAL | 0 refills | Status: DC

## 2021-10-08 MED ORDER — METHYLPREDNISOLONE ACETATE 80 MG/ML IJ SUSP
40.0000 mg | Freq: Once | INTRAMUSCULAR | Status: AC
  Administered 2021-10-08: 40 mg via INTRAMUSCULAR

## 2021-10-08 NOTE — Progress Notes (Signed)
Acute Office Visit  Subjective:    Patient ID: Geoffrey Evans, male    DOB: 09/26/1961, 60 y.o.   MRN: [id number]  Chief Complaint  Patient presents with   Rash    Noticed this morning, located on the left lower back to left buttocks area to back of the left leg. Itching present. Red, raised.     HPI Patient is in today for Rash  Patient states that he noticed the rash this Shower did help with symptoms Itching and uncomfortable.  Never had shingles in the past. Not been vaccinated for shingles in the past. Patient states that he never had chicken pox as a child either. No inciting event. Does travel often for work and recently returned from an 11 day stent out of town.  No new medications or supplements per patient report. Unsure about other products he has came into contact with since he has been traveling.   Past Medical History:  Diagnosis Date   OSA (obstructive sleep apnea) 2010   Seasonal allergies     Past Surgical History:  Procedure Laterality Date   COLONOSCOPY  04/2014    Family History  Problem Relation Age of Onset   Heart failure Father    Diabetes Brother    Cancer Neg Hx    Alcohol abuse Neg Hx    Early death Neg Hx    Hyperlipidemia Neg Hx    Hypertension Neg Hx    Kidney disease Neg Hx    Learning disabilities Neg Hx    Stroke Neg Hx    COPD Neg Hx    Depression Neg Hx    Colon cancer Neg Hx    Esophageal cancer Neg Hx    Rectal cancer Neg Hx    Stomach cancer Neg Hx     Social History   Socioeconomic History   Marital status: Married    Spouse name: Not on file   Number of children: Not on file   Years of education: Not on file   Highest education level: Not on file  Occupational History   Not on file  Tobacco Use   Smoking status: Never   Smokeless tobacco: Former    Types: Chew    Quit date: 03/23/1990  Vaping Use   Vaping Use: Never used  Substance and Sexual Activity   Alcohol use: No    Alcohol/week: 0.0 standard  drinks    Comment: rare   Drug use: No   Sexual activity: Not Currently  Other Topics Concern   Not on file  Social History Narrative   Not on file   Social Determinants of Health   Financial Resource Strain: Not on file  Food Insecurity: Not on file  Transportation Needs: Not on file  Physical Activity: Not on file  Stress: Not on file  Social Connections: Not on file  Intimate Partner Violence: Not on file    Outpatient Medications Prior to Visit  Medication Sig Dispense Refill   Multiple Vitamins-Minerals (MULTIVITAMIN WITH MINERALS) tablet Take 1 tablet by mouth daily.     vardenafil (LEVITRA) 10 MG tablet Take 1 tablet (10 mg total) by mouth daily as needed for erectile dysfunction. 10 tablet 5   etodolac (LODINE XL) 500 MG 24 hr tablet Take 1 tablet (500 mg total) by mouth daily. 30 tablet 0   No facility-administered medications prior to visit.    No Known Allergies  Review of Systems  Constitutional:  Negative for chills and fever.  HENT:  Negative for trouble swallowing and voice change.   Eyes:  Negative for visual disturbance.  Respiratory:  Negative for cough and shortness of breath.   Cardiovascular:  Negative for chest pain.      Objective:    Physical Exam Vitals and nursing note reviewed.  Constitutional:      Appearance: He is obese.  Cardiovascular:     Rate and Rhythm: Normal rate and regular rhythm.  Pulmonary:     Effort: Pulmonary effort is normal.     Breath sounds: Normal breath sounds.  Skin:    General: Skin is warm.     Coloration: Skin is not jaundiced.     Findings: Erythema, lesion and rash present. No bruising.          Comments: Erythema and papular rash. Almost whelp in nature. See attached photo  Neurological:     Mental Status: He is alert.       BP 118/62   Pulse 71   Temp 98.1 F (36.7 C)   Resp 20   Ht 5\' 11"  (1.803 m)   Wt 278 lb 2 oz (126.2 kg)   SpO2 96%   BMI 38.79 kg/m  Wt Readings from Last 3  Encounters:  10/08/21 278 lb 2 oz (126.2 kg)  09/23/21 273 lb (123.8 kg)  08/22/21 275 lb (124.7 kg)    Health Maintenance Due  Topic Date Due   Zoster Vaccines- Shingrix (1 of 2) Never done   COVID-19 Vaccine (3 - Booster for Pfizer series) 08/24/2020    There are no preventive care reminders to display for this patient.   Lab Results  Component Value Date   TSH 1.68 04/16/2020   Lab Results  Component Value Date   WBC 6.3 04/13/2017   HGB 14.6 04/13/2017   HCT 43.7 04/13/2017   MCV 84.9 04/13/2017   PLT 221.0 04/13/2017   Lab Results  Component Value Date   NA 138 09/23/2021   K 4.0 09/23/2021   CO2 25 09/23/2021   GLUCOSE 82 09/23/2021   BUN 20 09/23/2021   CREATININE 1.01 09/23/2021   BILITOT 0.5 04/16/2020   ALKPHOS 54 04/16/2020   AST 21 04/16/2020   ALT 18 04/16/2020   PROT 6.9 04/16/2020   ALBUMIN 4.4 04/16/2020   CALCIUM 9.5 09/23/2021   ANIONGAP 9 08/22/2021   GFR 81.71 09/23/2021   Lab Results  Component Value Date   CHOL 183 09/23/2021   Lab Results  Component Value Date   HDL 31.50 (L) 09/23/2021   Lab Results  Component Value Date   LDLCALC 117 (H) 04/16/2020   Lab Results  Component Value Date   TRIG 307.0 (H) 09/23/2021   Lab Results  Component Value Date   CHOLHDL 6 09/23/2021   Lab Results  Component Value Date   HGBA1C 5.6 09/23/2021       Assessment & Plan:   Problem List Items Addressed This Visit   None Visit Diagnoses     Allergic dermatitis    -  Primary   Relevant Medications   levocetirizine (XYZAL) 5 MG tablet   methylPREDNISolone acetate (DEPO-MEDROL) injection 40 mg (Completed)        No orders of the defined types were placed in this encounter.    Romilda Garret, NP

## 2021-10-08 NOTE — Patient Instructions (Signed)
Nice to see you today Will give you a dose of steroids in office.  If it continues to spread, you have trouble breathing, swallowing, or talking be seen in a urgent care or emergency department

## 2021-10-08 NOTE — Assessment & Plan Note (Signed)
/  Sudden onset rash to left and left posterior proximal thigh.  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  States that taking a shower did make it better.  Low suspicion for shingles given appearance of rash.  We will keep it on radar did give IM injection of steroids today along with oral antihistamine if no improvement or continues to get worse he can be seen at urgent care or emergency department setting or reach out to [place] during office hours.  Signs and symptoms reviewed as to when he needs to seek emergent care.

## 2022-08-12 ENCOUNTER — Encounter: Payer: Self-pay | Admitting: Family Medicine

## 2022-08-12 ENCOUNTER — Ambulatory Visit: Payer: 59 | Admitting: Family Medicine

## 2022-08-12 VITALS — BP 126/70 | HR 65 | Temp 97.5°F | Ht 71.0 in | Wt 275.0 lb

## 2022-08-12 DIAGNOSIS — M79606 Pain in leg, unspecified: Secondary | ICD-10-CM | POA: Diagnosis not present

## 2022-08-12 DIAGNOSIS — W57XXXA Bitten or stung by nonvenomous insect and other nonvenomous arthropods, initial encounter: Secondary | ICD-10-CM | POA: Diagnosis not present

## 2022-08-12 DIAGNOSIS — S80862A Insect bite (nonvenomous), left lower leg, initial encounter: Secondary | ICD-10-CM | POA: Diagnosis not present

## 2022-08-12 MED ORDER — TRIAMCINOLONE ACETONIDE 0.1 % EX CREA: 1.0000 | TOPICAL_CREAM | Freq: Two times a day (BID) | CUTANEOUS | 0 refills | Status: DC

## 2022-08-12 MED ORDER — ETODOLAC 500 MG PO TABS: 500.0000 mg | ORAL_TABLET | Freq: Every day | ORAL | 0 refills | Status: DC | PRN

## 2022-08-12 NOTE — Patient Instructions (Signed)
Use the steroid cream no longer than 5 days and then stop.

## 2022-08-12 NOTE — Progress Notes (Signed)
Subjective:     Patient ID: Geoffrey Evans, male    DOB: 1961/05/25, 61 y.o.   MRN: [id number]  Chief Complaint  Patient presents with   Rash    Rash/bug bites on left foot accompanied by some swelling on left leg first noticed a week ago. States it looked like it was getting worse every day but as of this morning the rash has subsided some but still has just a little bit of swelling.     HPI Patient is in today for insect bites of his left lower leg and ankle.  He had swelling which has resolved.  Denies itching.  He has been using hydrocortisone over-the-counter.  He also requests a refill of an anti-inflammatory that his PCP had prescribed for leg pain  No fever, chills, nausea, vomiting.  Health Maintenance Due  Topic Date Due   Zoster Vaccines- Shingrix (1 of 2) Never done   TETANUS/TDAP  07/11/2022    Past Medical History:  Diagnosis Date   OSA (obstructive sleep apnea) 2010   Seasonal allergies     Past Surgical History:  Procedure Laterality Date   COLONOSCOPY  04/2014    Family History  Problem Relation Age of Onset   Heart failure Father    Diabetes Brother    Cancer Neg Hx    Alcohol abuse Neg Hx    Early death Neg Hx    Hyperlipidemia Neg Hx    Hypertension Neg Hx    Kidney disease Neg Hx    Learning disabilities Neg Hx    Stroke Neg Hx    COPD Neg Hx    Depression Neg Hx    Colon cancer Neg Hx    Esophageal cancer Neg Hx    Rectal cancer Neg Hx    Stomach cancer Neg Hx     Social History   Socioeconomic History   Marital status: Married    Spouse name: Not on file   Number of children: Not on file   Years of education: Not on file   Highest education level: Not on file  Occupational History   Not on file  Tobacco Use   Smoking status: Never   Smokeless tobacco: Former    Types: Chew    Quit date: 03/23/1990  Vaping Use   Vaping Use: Never used  Substance and Sexual Activity   Alcohol use: No    Alcohol/week: 0.0 standard  drinks of alcohol    Comment: rare   Drug use: No   Sexual activity: Not Currently  Other Topics Concern   Not on file  Social History Narrative   Not on file   Social Determinants of Health   Financial Resource Strain: Not on file  Food Insecurity: Not on file  Transportation Needs: Not on file  Physical Activity: Not on file  Stress: Not on file  Social Connections: Not on file  Intimate Partner Violence: Not on file    Outpatient Medications Prior to Visit  Medication Sig Dispense Refill   Multiple Vitamins-Minerals (MULTIVITAMIN WITH MINERALS) tablet Take 1 tablet by mouth daily.     vardenafil (LEVITRA) 10 MG tablet Take 1 tablet (10 mg total) by mouth daily as needed for erectile dysfunction. 10 tablet 5   levocetirizine (XYZAL) 5 MG tablet Take 1 tablet (5 mg total) by mouth every evening for 15 days. (Patient not taking: Reported on 08/12/2022) 15 tablet 0   No facility-administered medications prior to visit.    No Known  Allergies  ROS     Objective:    Physical Exam  BP 126/70 (BP Location: Left Arm, Patient Position: Sitting, Cuff Size: Large)   Pulse 65   Temp (!) 97.5 F (36.4 C) (Temporal)   Ht '5\' 11"'$  (1.803 m)   Wt 275 lb (124.7 kg)   SpO2 96%   BMI 38.35 kg/m  Wt Readings from Last 3 Encounters:  08/12/22 275 lb (124.7 kg)  10/08/21 278 lb 2 oz (126.2 kg)  09/23/21 273 lb (123.8 kg)   Alert and oriented and in no acute distress.  Respirations unlabored.  Left medial ankle with multiple insect bites, no drainage, swelling or induration.  No sign of infection.     Assessment & Plan:   Problem List Items Addressed This Visit   None Visit Diagnoses     Insect bite of left lower leg, initial encounter    -  Primary   Relevant Medications   triamcinolone cream (KENALOG) 0.1 %   Pain of lower extremity, unspecified laterality       Relevant Medications   etodolac (LODINE) 500 MG tablet      Triamcinolone for insect bites x5 days and then  stop.  He will let us know if he develops any symptoms of a secondary bacterial infection. Etodolac refilled for patient for arthritis type leg pain.  I am having Geoffrey Cradle. Evans start on triamcinolone cream and etodolac. I am also having him maintain his multivitamin with minerals, vardenafil, and levocetirizine.  Meds ordered this encounter  Medications   triamcinolone cream (KENALOG) 0.1 %    Sig: Apply 1 Application topically 2 (two) times daily.    Dispense:  30 g    Refill:  0    Order Specific Question:   Supervising Provider    Answer:   Pricilla Holm A [[id number]   etodolac (LODINE) 500 MG tablet    Sig: Take 1 tablet (500 mg total) by mouth daily as needed.    Dispense:  30 tablet    Refill:  0    Order Specific Question:   Supervising Provider    Answer:   Pricilla Holm A [[id number]]

## 2022-08-17 ENCOUNTER — Encounter: Payer: Self-pay | Admitting: Internal Medicine

## 2022-11-30 ENCOUNTER — Encounter: Payer: Self-pay | Admitting: Internal Medicine

## 2022-11-30 ENCOUNTER — Ambulatory Visit (INDEPENDENT_AMBULATORY_CARE_PROVIDER_SITE_OTHER): Payer: 59 | Admitting: Internal Medicine

## 2022-11-30 VITALS — BP 130/76 | HR 61 | Temp 97.8°F | Ht 71.0 in | Wt 275.0 lb

## 2022-11-30 DIAGNOSIS — Z23 Encounter for immunization: Secondary | ICD-10-CM

## 2022-11-30 DIAGNOSIS — N521 Erectile dysfunction due to diseases classified elsewhere: Secondary | ICD-10-CM

## 2022-11-30 DIAGNOSIS — Z6838 Body mass index (BMI) 38.0-38.9, adult: Secondary | ICD-10-CM

## 2022-11-30 DIAGNOSIS — K635 Polyp of colon: Secondary | ICD-10-CM

## 2022-11-30 DIAGNOSIS — E781 Pure hyperglyceridemia: Secondary | ICD-10-CM | POA: Diagnosis not present

## 2022-11-30 DIAGNOSIS — Z125 Encounter for screening for malignant neoplasm of prostate: Secondary | ICD-10-CM | POA: Diagnosis not present

## 2022-11-30 DIAGNOSIS — E66812 Obesity, class 2: Secondary | ICD-10-CM | POA: Insufficient documentation

## 2022-11-30 DIAGNOSIS — Z0000 Encounter for general adult medical examination without abnormal findings: Secondary | ICD-10-CM

## 2022-11-30 LAB — CBC WITH DIFFERENTIAL/PLATELET
Basophils Absolute: 0 10*3/uL (ref 0.0–0.1)
Basophils Relative: 0.3 % (ref 0.0–3.0)
Eosinophils Absolute: 0.1 10*3/uL (ref 0.0–0.7)
Eosinophils Relative: 0.8 % (ref 0.0–5.0)
HCT: 46.2 % (ref 39.0–52.0)
Hemoglobin: 15.5 g/dL (ref 13.0–17.0)
Lymphocytes Relative: 26 % (ref 12.0–46.0)
Lymphs Abs: 1.7 10*3/uL (ref 0.7–4.0)
MCHC: 33.5 g/dL (ref 30.0–36.0)
MCV: 86.1 fl (ref 78.0–100.0)
Monocytes Absolute: 0.5 10*3/uL (ref 0.1–1.0)
Monocytes Relative: 8.4 % (ref 3.0–12.0)
Neutro Abs: 4.1 10*3/uL (ref 1.4–7.7)
Neutrophils Relative %: 64.5 % (ref 43.0–77.0)
Platelets: 223 10*3/uL (ref 150.0–400.0)
RBC: 5.37 Mil/uL (ref 4.22–5.81)
RDW: 14.3 % (ref 11.5–15.5)
WBC: 6.4 10*3/uL (ref 4.0–10.5)

## 2022-11-30 LAB — BASIC METABOLIC PANEL
BUN: 17 mg/dL (ref 6–23)
CO2: 32 mEq/L (ref 19–32)
Calcium: 9.4 mg/dL (ref 8.4–10.5)
Chloride: 102 mEq/L (ref 96–112)
Creatinine, Ser: 1.07 mg/dL (ref 0.40–1.50)
GFR: 75.61 mL/min (ref >60.00)
Glucose, Bld: 85 mg/dL (ref 70–99)
Potassium: 4.5 mEq/L (ref 3.5–5.1)
Sodium: 138 mEq/L (ref 135–145)

## 2022-11-30 LAB — TSH: TSH: 1.38 u[IU]/mL (ref 0.35–5.50)

## 2022-11-30 LAB — HEPATIC FUNCTION PANEL
ALT: 18 U/L (ref 0–53)
AST: 21 U/L (ref 0–37)
Albumin: 4.4 g/dL (ref 3.5–5.2)
Alkaline Phosphatase: 51 U/L (ref 39–117)
Bilirubin, Direct: 0.1 mg/dL (ref 0.0–0.3)
Total Bilirubin: 0.5 mg/dL (ref 0.2–1.2)
Total Protein: 7 g/dL (ref 6.0–8.3)

## 2022-11-30 LAB — LIPID PANEL
Cholesterol: 182 mg/dL (ref 0–200)
HDL: 37.6 mg/dL — ABNORMAL LOW (ref >39.00)
LDL Cholesterol: 106 mg/dL — ABNORMAL HIGH (ref 0–99)
NonHDL: 144.05
Total CHOL/HDL Ratio: 5
Triglycerides: 188 mg/dL — ABNORMAL HIGH (ref 0.0–149.0)
VLDL: 37.6 mg/dL (ref 0.0–40.0)

## 2022-11-30 LAB — PSA: PSA: 1.47 ng/mL (ref 0.10–4.00)

## 2022-11-30 LAB — HEMOGLOBIN A1C: Hgb A1c MFr Bld: 5.6 % (ref 4.6–6.5)

## 2022-11-30 MED ORDER — VARDENAFIL HCL 10 MG PO TABS: 10.0000 mg | ORAL_TABLET | Freq: Every day | ORAL | 5 refills | Status: DC | PRN

## 2022-11-30 NOTE — Progress Notes (Signed)
Subjective:  Patient ID: Geoffrey Evans, male    DOB: 1961/09/24  Age: 61 y.o. MRN: [id number]  CC: Annual Exam and Hyperlipidemia   HPI DEAUNDRA KUTZER presents for a CPX and f/up -   He walks about 10,000 steps a day.  His endurance is good.  He denies chest pain, shortness of breath, diaphoresis, or edema.  Outpatient Medications Prior to Visit  Medication Sig Dispense Refill   Multiple Vitamins-Minerals (MULTIVITAMIN WITH MINERALS) tablet Take 1 tablet by mouth daily.     triamcinolone cream (KENALOG) 0.1 % Apply 1 Application topically 2 (two) times daily. 30 g 0   etodolac (LODINE) 500 MG tablet Take 1 tablet (500 mg total) by mouth daily as needed. 30 tablet 0   vardenafil (LEVITRA) 10 MG tablet Take 1 tablet (10 mg total) by mouth daily as needed for erectile dysfunction. 10 tablet 5   levocetirizine (XYZAL) 5 MG tablet Take 1 tablet (5 mg total) by mouth every evening for 15 days. (Patient not taking: Reported on 08/12/2022) 15 tablet 0   No facility-administered medications prior to visit.    ROS Review of Systems  Constitutional: Negative.  Negative for diaphoresis and fatigue.  HENT: Negative.    Eyes: Negative.  Negative for visual disturbance.  Respiratory: Negative.  Negative for cough, chest tightness, shortness of breath and wheezing.   Cardiovascular:  Negative for chest pain, palpitations and leg swelling.  Gastrointestinal:  Negative for abdominal pain, diarrhea, nausea and vomiting.  Endocrine: Negative.   Genitourinary: Negative.  Negative for difficulty urinating and dysuria.       ++ED  Musculoskeletal: Negative.   Skin: Negative.   Neurological:  Negative for dizziness and weakness.  Hematological:  Negative for adenopathy. Does not bruise/bleed easily.  Psychiatric/Behavioral: Negative.      Objective:  BP 130/76 (BP Location: Left Arm, Patient Position: Sitting, Cuff Size: Large)   Pulse 61   Temp 97.8 F (36.6 C) (Oral)   Ht '5\' 11"'$  (1.803  m)   Wt 275 lb (124.7 kg)   SpO2 98%   BMI 38.35 kg/m   BP Readings from Last 3 Encounters:  11/30/22 130/76  08/12/22 126/70  10/08/21 118/62    Wt Readings from Last 3 Encounters:  11/30/22 275 lb (124.7 kg)  08/12/22 275 lb (124.7 kg)  10/08/21 278 lb 2 oz (126.2 kg)    Physical Exam Vitals reviewed.  HENT:     Nose: Nose normal.     Mouth/Throat:     Mouth: Mucous membranes are moist.  Eyes:     General: No scleral icterus.    Conjunctiva/sclera: Conjunctivae normal.  Cardiovascular:     Rate and Rhythm: Normal rate and regular rhythm.     Heart sounds: No murmur heard. Pulmonary:     Effort: Pulmonary effort is normal.     Breath sounds: No stridor. No wheezing, rhonchi or rales.  Abdominal:     General: Abdomen is flat.     Palpations: There is no mass.     Tenderness: There is no abdominal tenderness. There is no guarding.     Hernia: No hernia is present. There is no hernia in the left inguinal area or right inguinal area.  Genitourinary:    Pubic Area: No rash.      Penis: Normal and circumcised.      Testes: Normal.     Epididymis:     Right: Normal.     Left: Normal.  Prostate: Normal. Not enlarged, not tender and no nodules present.     Rectum: Normal. Guaiac result negative. No mass, tenderness, anal fissure, external hemorrhoid or internal hemorrhoid. Normal anal tone.  Musculoskeletal:        General: Normal range of motion.     Cervical back: Neck supple.     Right lower leg: No edema.     Left lower leg: No edema.  Lymphadenopathy:     Cervical: No cervical adenopathy.     Lower Body: No right inguinal adenopathy. No left inguinal adenopathy.  Skin:    General: Skin is warm and dry.     Findings: No rash.  Neurological:     General: No focal deficit present.     Mental Status: He is alert. Mental status is at baseline.  Psychiatric:        Mood and Affect: Mood normal.        Behavior: Behavior normal.     Lab Results  Component  Value Date   WBC 6.4 11/30/2022   HGB 15.5 11/30/2022   HCT 46.2 11/30/2022   PLT 223.0 11/30/2022   GLUCOSE 85 11/30/2022   CHOL 182 11/30/2022   TRIG 188.0 (H) 11/30/2022   HDL 37.60 (L) 11/30/2022   LDLDIRECT 116.0 09/23/2021   LDLCALC 106 (H) 11/30/2022   ALT 18 11/30/2022   AST 21 11/30/2022   NA 138 11/30/2022   K 4.5 11/30/2022   CL 102 11/30/2022   CREATININE 1.07 11/30/2022   BUN 17 11/30/2022   CO2 32 11/30/2022   TSH 1.38 11/30/2022   PSA 1.47 11/30/2022   HGBA1C 5.6 11/30/2022    DG Chest Portable 1 View  Result Date: 08/22/2021 CLINICAL DATA:  COVID positive with cough. EXAM: PORTABLE CHEST 1 VIEW COMPARISON:  None. FINDINGS: There is no evidence of acute infiltrate, pleural effusion or pneumothorax. The heart size and mediastinal contours are within normal limits. The visualized skeletal structures are unremarkable. IMPRESSION: No active cardiopulmonary disease. Electronically Signed   By: Virgina Norfolk M.D.   On: 08/22/2021 23:50    Assessment & Plan:   Keller was seen today for annual exam and hyperlipidemia.  Diagnoses and all orders for this visit:  Pure hyperglyceridemia- Improvement noted. -     Lipid panel; Future -     Lipid panel  Erectile dysfunction due to diseases classified elsewhere -     vardenafil (LEVITRA) 10 MG tablet; Take 1 tablet (10 mg total) by mouth daily as needed for erectile dysfunction.  Routine general medical examination at a health care facility- Exam completed, labs reviewed- statin not indicated, vaccines reviewed, cancer screenings addressed, pt ed material was given. -     PSA; Future -     PSA  Class 2 severe obesity due to excess calories with serious comorbidity and body mass index (BMI) of 38.0 to 38.9 in adult [facility])- Labs are negative for causes/complications. -     Hepatic function panel; Future -     Hemoglobin A1c; Future -     CBC with Differential/Platelet; Future -     Basic metabolic panel; Future -      TSH; Future -     TSH -     Basic metabolic panel -     CBC with Differential/Platelet -     Hemoglobin A1c -     Hepatic function panel  Polyp of colon, unspecified part of colon, unspecified type -     Ambulatory referral to  Gastroenterology  Other orders -     Tdap vaccine greater than or equal to 7yo IM -     Zoster Recombinant (Shingrix )   I have discontinued Charlcie Cradle. Berke's etodolac. I am also having him maintain his multivitamin with minerals, levocetirizine, triamcinolone cream, and vardenafil.  Meds ordered this encounter  Medications   vardenafil (LEVITRA) 10 MG tablet    Sig: Take 1 tablet (10 mg total) by mouth daily as needed for erectile dysfunction.    Dispense:  10 tablet    Refill:  5     Follow-up: Return in about 6 months (around 06/01/2023).  Scarlette Calico, MD

## 2022-11-30 NOTE — Patient Instructions (Addendum)
PLEASE COME BACK IN 2-6 MONTHS FOR YOUR SECOND SHINGLES VACCINE   Health Maintenance, Male Adopting a healthy lifestyle and getting preventive care are important in promoting health and wellness. Ask your health care provider about: The right schedule for you to have regular tests and exams. Things you can do on your own to prevent diseases and keep yourself healthy. What should I know about diet, weight, and exercise? Eat a healthy diet  Eat a diet that includes plenty of vegetables, fruits, low-fat dairy products, and lean protein. Do not eat a lot of foods that are high in solid fats, added sugars, or sodium. Maintain a healthy weight Body mass index (BMI) is a measurement that can be used to identify possible weight problems. It estimates body fat based on height and weight. Your health care provider can help determine your BMI and help you achieve or maintain a healthy weight. Get regular exercise Get regular exercise. This is one of the most important things you can do for your health. Most adults should: Exercise for at least 150 minutes each week. The exercise should increase your heart rate and make you sweat (moderate-intensity exercise). Do strengthening exercises at least twice a week. This is in addition to the moderate-intensity exercise. Spend less time sitting. Even light physical activity can be beneficial. Watch cholesterol and blood lipids Have your blood tested for lipids and cholesterol at 60 years of age, then have this test every 5 years. You may need to have your cholesterol levels checked more often if: Your lipid or cholesterol levels are high. You are older than 60 years of age. You are at high risk for heart disease. What should I know about cancer screening? Many types of cancers can be detected early and may often be prevented. Depending on your health history and family history, you may need to have cancer screening at various ages. This may include screening  for: Colorectal cancer. Prostate cancer. Skin cancer. Lung cancer. What should I know about heart disease, diabetes, and high blood pressure? Blood pressure and heart disease High blood pressure causes heart disease and increases the risk of stroke. This is more likely to develop in people who have high blood pressure readings or are overweight. Talk with your health care provider about your target blood pressure readings. Have your blood pressure checked: Every 3-5 years if you are 75-1 years of age. Every year if you are 67 years old or older. If you are between the ages of 70 and 3 and are a current or former smoker, ask your health care provider if you should have a one-time screening for abdominal aortic aneurysm (AAA). Diabetes Have regular diabetes screenings. This checks your fasting blood sugar level. Have the screening done: Once every three years after age 59 if you are at a normal weight and have a low risk for diabetes. More often and at a younger age if you are overweight or have a high risk for diabetes. What should I know about preventing infection? Hepatitis B If you have a higher risk for hepatitis B, you should be screened for this virus. Talk with your health care provider to find out if you are at risk for hepatitis B infection. Hepatitis C Blood testing is recommended for: Everyone born from [birth year] through 1964. Anyone with known risk factors for hepatitis C. Sexually transmitted infections (STIs) You should be screened each year for STIs, including gonorrhea and chlamydia, if: You are sexually active and are younger than 60  years of age. You are older than 60 years of age and your health care provider tells you that you are at risk for this type of infection. Your sexual activity has changed since you were last screened, and you are at increased risk for chlamydia or gonorrhea. Ask your health care provider if you are at risk. Ask your health care provider about  whether you are at high risk for HIV. Your health care provider may recommend a prescription medicine to help prevent HIV infection. If you choose to take medicine to prevent HIV, you should first get tested for HIV. You should then be tested every 3 months for as long as you are taking the medicine. Follow these instructions at home: Alcohol use Do not drink alcohol if your health care provider tells you not to drink. If you drink alcohol: Limit how much you have to 0-2 drinks a day. Know how much alcohol is in your drink. In the [place] one drink equals one 12 oz bottle of beer (355 mL), one 5 oz glass of wine (148 mL), or one 1 oz glass of hard liquor (44 mL). Lifestyle Do not use any products that contain nicotine or tobacco. These products include cigarettes, chewing tobacco, and vaping devices, such as e-cigarettes. If you need help quitting, ask your health care provider. Do not use street drugs. Do not share needles. Ask your health care provider for help if you need support or information about quitting drugs. General instructions Schedule regular health, dental, and eye exams. Stay current with your vaccines. Tell your health care provider if: You often feel depressed. You have ever been abused or do not feel safe at home. Summary Adopting a healthy lifestyle and getting preventive care are important in promoting health and wellness. Follow your health care provider's instructions about healthy diet, exercising, and getting tested or screened for diseases. Follow your health care provider's instructions on monitoring your cholesterol and blood pressure. This information is not intended to replace advice given to you by your health care provider. Make sure you discuss any questions you have with your health care provider. Document Revised: 05/03/2021 Document Reviewed: 05/03/2021 Elsevier Patient Education  [employer].

## 2022-12-21 ENCOUNTER — Encounter: Payer: Self-pay | Admitting: Internal Medicine

## 2023-01-12 IMAGING — DX DG CHEST 1V PORT
1 series · 1 of 1 positions shown · non-contrast
Comparison: None.

CLINICAL DATA: COVID positive with cough.

EXAM:
PORTABLE CHEST 1 VIEW

[chest]
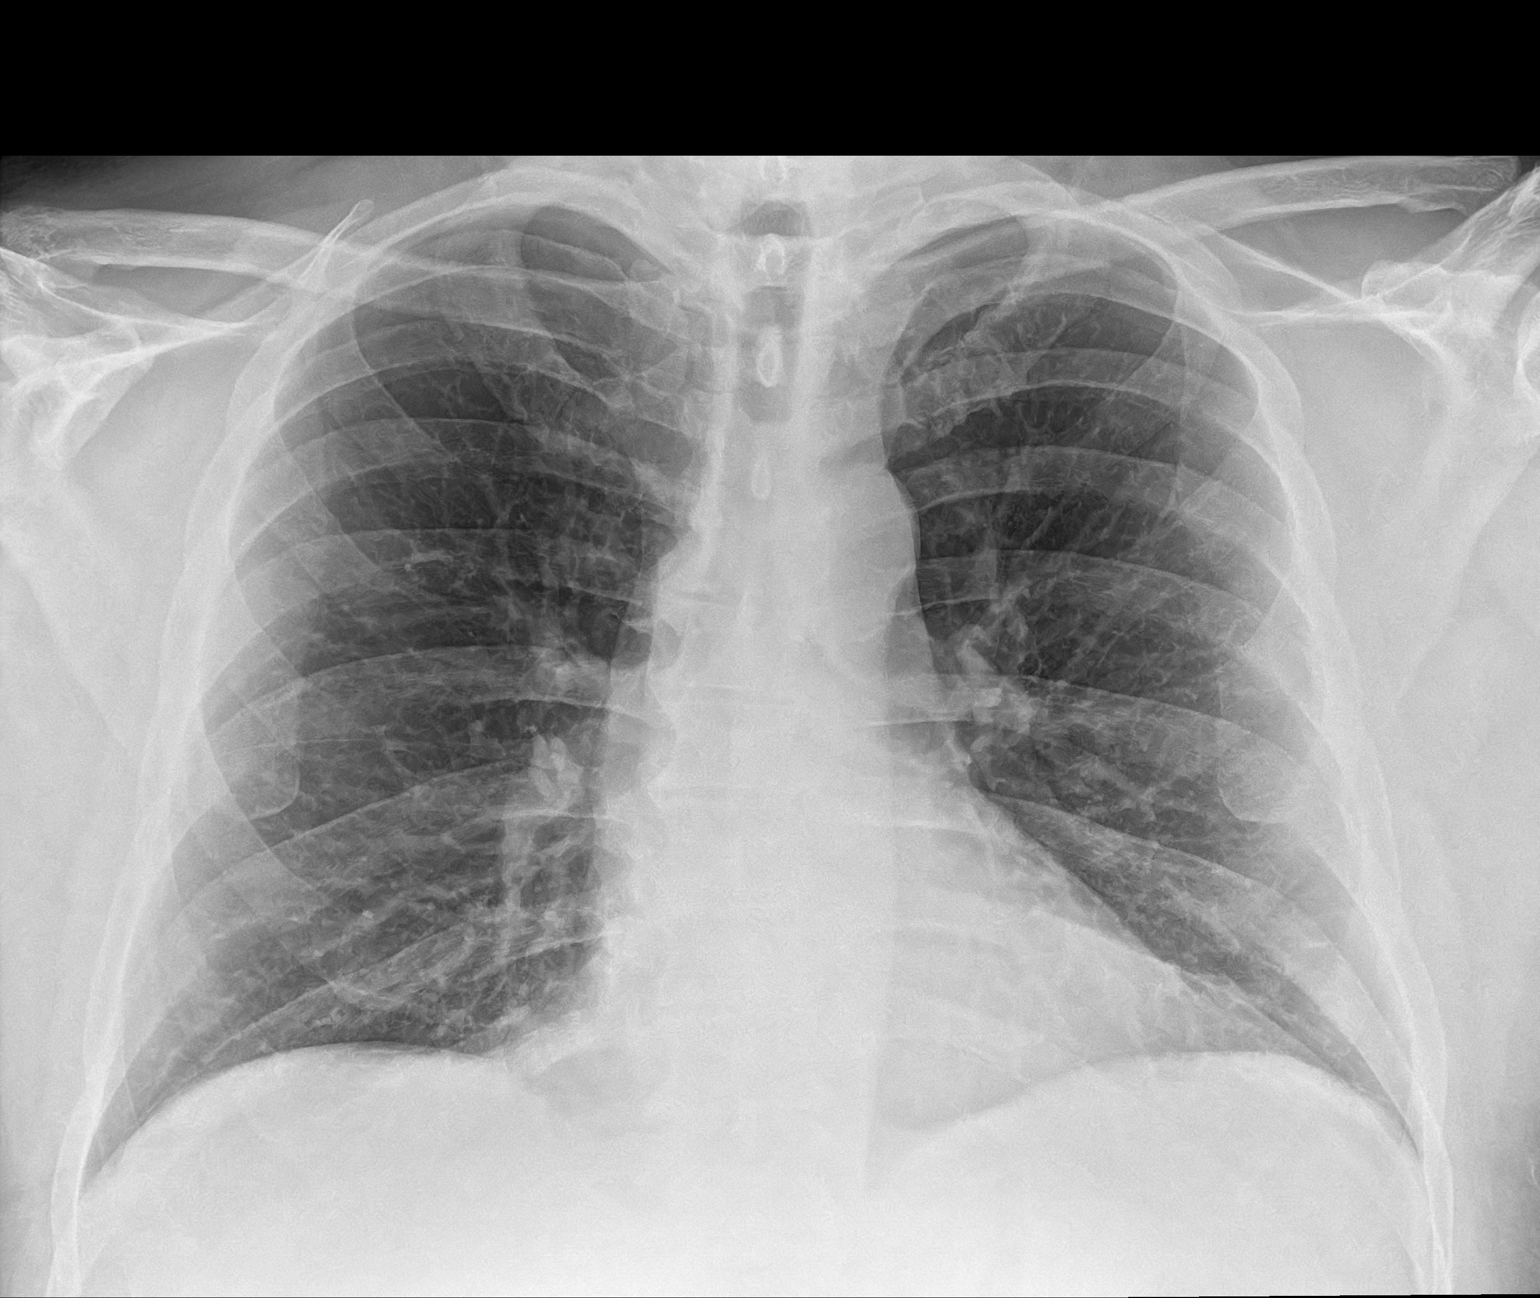

[1 of 1 positions shown; findings below may reference images not displayed]

FINDINGS: There is no evidence of acute infiltrate, pleural effusion or
pneumothorax. The heart size and mediastinal contours are within
normal limits. The visualized skeletal structures are unremarkable.
IMPRESSION: No active cardiopulmonary disease.

## 2023-01-24 ENCOUNTER — Telehealth: Payer: Self-pay | Admitting: Internal Medicine

## 2023-01-24 ENCOUNTER — Ambulatory Visit (AMBULATORY_SURGERY_CENTER): Payer: 59 | Admitting: *Deleted

## 2023-01-24 VITALS — Ht 71.0 in | Wt 270.0 lb

## 2023-01-24 DIAGNOSIS — Z86010 Personal history of colonic polyps: Secondary | ICD-10-CM

## 2023-01-24 MED ORDER — NA SULFATE-K SULFATE-MG SULF 17.5-3.13-1.6 GM/177ML PO SOLN: 1.0000 | Freq: Once | ORAL | 0 refills | Status: AC

## 2023-01-24 NOTE — Telephone Encounter (Signed)
Patient is returning call for previsit appointment. Please advise.

## 2023-01-24 NOTE — Progress Notes (Signed)

## 2023-02-10 ENCOUNTER — Encounter: Payer: Self-pay | Admitting: Internal Medicine

## 2023-02-10 ENCOUNTER — Encounter: Payer: Self-pay | Admitting: Gastroenterology

## 2023-02-22 ENCOUNTER — Encounter: Payer: Self-pay | Admitting: Internal Medicine

## 2023-02-22 ENCOUNTER — Ambulatory Visit (AMBULATORY_SURGERY_CENTER): Payer: 59 | Admitting: Internal Medicine

## 2023-02-22 VITALS — BP 120/69 | HR 55 | Temp 97.1°F | Resp 16 | Ht 71.0 in | Wt 270.0 lb

## 2023-02-22 DIAGNOSIS — Z86010 Personal history of colonic polyps: Secondary | ICD-10-CM | POA: Diagnosis not present

## 2023-02-22 DIAGNOSIS — Z09 Encounter for follow-up examination after completed treatment for conditions other than malignant neoplasm: Secondary | ICD-10-CM | POA: Diagnosis present

## 2023-02-22 LAB — HM COLONOSCOPY

## 2023-02-22 MED ORDER — SODIUM CHLORIDE 0.9 % IV SOLN: 500.0000 mL | INTRAVENOUS | Status: DC

## 2023-02-22 NOTE — Progress Notes (Signed)
Sedate, gd SR, tolerated procedure well, VSS, report to RN 

## 2023-02-22 NOTE — Patient Instructions (Addendum)
Recommendation:- Repeat colonoscopy in 10 years for surveillance.                           - Patient has a contact number available for                            emergencies. The signs and symptoms of potential                            delayed complications were discussed with the                            patient. Return to normal activities tomorrow.                            Written discharge instructions were provided to the                            patient.                           - Resume previous diet.                           - Continue present medications.  Handout on hemorrhoids given.  YOU HAD AN ENDOSCOPIC PROCEDURE TODAY AT [facility]:   Refer to the procedure report that was given to you for any specific questions about what was found during the examination.  If the procedure report does not answer your questions, please call your gastroenterologist to clarify.  If you requested that your care partner not be given the details of your procedure findings, then the procedure report has been included in a sealed envelope for you to review at your convenience later.  YOU SHOULD EXPECT: Some feelings of bloating in the abdomen. Passage of more gas than usual.  Walking can help get rid of the air that was put into your GI tract during the procedure and reduce the bloating. If you had a lower endoscopy (such as a colonoscopy or flexible sigmoidoscopy) you may notice spotting of blood in your stool or on the toilet paper. If you underwent a bowel prep for your procedure, you may not have a normal bowel movement for a few days.  Please Note:  You might notice some irritation and congestion in your nose or some drainage.  This is from the oxygen used during your procedure.  There is no need for concern and it should clear up in a day or so.  SYMPTOMS TO REPORT IMMEDIATELY:  Following lower endoscopy (colonoscopy or flexible sigmoidoscopy):  Excessive amounts of  blood in the stool  Significant tenderness or worsening of abdominal pains  Swelling of the abdomen that is new, acute  Fever of 100F or higher  For urgent or emergent issues, a gastroenterologist can be reached at any hour by calling [phone]. Do not use MyChart messaging for urgent concerns.   DIET:  We do recommend a small meal at first, but then you may proceed to your regular diet.  Drink plenty of fluids but you should avoid alcoholic beverages for 24 hours.  ACTIVITY:  You should plan  to take it easy for the rest of today and you should NOT DRIVE or use heavy machinery until tomorrow (because of the sedation medicines used during the test).    FOLLOW UP: Our staff will call the number listed on your records the next business day following your procedure.  We will call around 7:15- 8:00 am to check on you and address any questions or concerns that you may have regarding the information given to you following your procedure. If we do not reach you, we will leave a message.     If any biopsies were taken you will be contacted by phone or by letter within the next 1-3 weeks.  Please call us at [phone] if you have not heard about the biopsies in 3 weeks.   SIGNATURES/CONFIDENTIALITY: You and/or your care partner have signed paperwork which will be entered into your electronic medical record.  These signatures attest to the fact that that the information above on your After Visit Summary has been reviewed and is understood.  Full responsibility of the confidentiality of this discharge information lies with you and/or your care-partner.

## 2023-02-22 NOTE — Progress Notes (Signed)
HISTORY OF PRESENT ILLNESS:  Geoffrey Evans is a 62 y.o. male with a prior history of advanced adenoma.  Previous examination 2015 ([place]) and here in 2018 (hyperplastic polyp).  Now for surveillance due to history of advanced adenoma  REVIEW OF SYSTEMS:  All non-GI ROS negative   Past Medical History:  Diagnosis Date   OSA (obstructive sleep apnea) 2010   Seasonal allergies    Sleep apnea    WEAR C PAP    Past Surgical History:  Procedure Laterality Date   COLONOSCOPY  04/2014   X 2    Social History [name]  reports that he has never smoked. He quit smokeless tobacco use about 32 years ago.  His smokeless tobacco use included chew. He reports that he does not drink alcohol and does not use drugs.  family history includes Colon polyps in his brother; Diabetes in his brother; Heart failure in his father.  No Known Allergies     PHYSICAL EXAMINATION: Vital signs: BP 129/74   Pulse (!) 53   Temp (!) 97.1 F (36.2 C)   Ht '5\' 11"'$  (1.803 m)   Wt 270 lb (122.5 kg)   SpO2 97%   BMI 37.66 kg/m  General: Well-developed, well-nourished, no acute distress HEENT: Sclerae are anicteric, conjunctiva pink. Oral mucosa intact Lungs: Clear Heart: Regular Abdomen: soft, nontender, nondistended, no obvious ascites, no peritoneal signs, normal bowel sounds. No organomegaly. Extremities: No edema Psychiatric: alert and oriented x3. Cooperative     ASSESSMENT:  History of advanced adenoma   PLAN:  Surveillance colonoscopy

## 2023-02-22 NOTE — Op Note (Signed)
[place] Patient Name: Nicolino Marvel Procedure Date: 02/22/2023 10:54 AM MRN: [id number] Endoscopist: Docia Chuck. Henrene Pastor , MD, [id number] Age: 62 Referring MD:  Date of Birth: 1961-04-26 Gender: Male Account #: [id number] Procedure:                Colonoscopy Indications:              High risk colon cancer surveillance: Personal                            history of adenoma 10 mm or greater in size ([place]                            2015); surveillance exam 2018 was negative for                            neoplasia Medicines:                Monitored Anesthesia Care Procedure:                Pre-Anesthesia Assessment:                           - Prior to the procedure, a History and Physical                            was performed, and patient medications and                            allergies were reviewed. The patient's tolerance of                            previous anesthesia was also reviewed. The risks                            and benefits of the procedure and the sedation                            options and risks were discussed with the patient.                            All questions were answered, and informed consent                            was obtained. Prior Anticoagulants: The patient has                            taken no anticoagulant or antiplatelet agents. ASA                            Grade Assessment: II - A patient with mild systemic                            disease. After reviewing the risks and benefits,  the patient was deemed in satisfactory condition to                            undergo the procedure.                           After obtaining informed consent, the colonoscope                            was passed under direct vision. Throughout the                            procedure, the patient's blood pressure, pulse, and                            oxygen saturations were monitored continuously. The                             CF [id number] was introduced through the anus                            and advanced to the the cecum, identified by                            appendiceal orifice and ileocecal valve. The                            ileocecal valve, appendiceal orifice, and rectum                            were photographed. The quality of the bowel                            preparation was excellent. The colonoscopy was                            performed with moderate difficulty (redundant                            colon, body habitus). The patient tolerated the                            procedure well. The bowel preparation used was                            SUPREP via split dose instruction. Scope In: 11:00:21 AM Scope Out: 11:21:36 AM Scope Withdrawal Time: 0 hours 8 minutes 38 seconds  Total Procedure Duration: 0 hours 21 minutes 15 seconds  Findings:                 Internal hemorrhoids were found during                            retroflexion. The hemorrhoids were small.  The entire examined colon appeared normal on direct                            and retroflexion views. The colon was redundant. Complications:            No immediate complications. Estimated blood loss:                            None. Estimated Blood Loss:     Estimated blood loss: none. Impression:               - Internal hemorrhoids.                           - The entire examined colon is normal on direct and                            retroflexion views.                           - Redundant colon. Recommendation:           - Repeat colonoscopy in 10 years for surveillance.                           - Patient has a contact number available for                            emergencies. The signs and symptoms of potential                            delayed complications were discussed with the                            patient. Return to normal activities  tomorrow.                            Written discharge instructions were provided to the                            patient.                           - Resume previous diet.                           - Continue present medications. Docia Chuck. Henrene Pastor, MD 02/22/2023 11:28:41 AM This report has been signed electronically.

## 2023-02-23 ENCOUNTER — Telehealth: Payer: Self-pay

## 2023-02-23 NOTE — Telephone Encounter (Signed)
No answer, unable to leave a message, B.Damiano Stamper RN. 

## 2023-03-06 ENCOUNTER — Ambulatory Visit (INDEPENDENT_AMBULATORY_CARE_PROVIDER_SITE_OTHER): Payer: 59 | Admitting: *Deleted

## 2023-03-06 DIAGNOSIS — Z23 Encounter for immunization: Secondary | ICD-10-CM | POA: Diagnosis not present

## 2023-03-06 NOTE — Progress Notes (Signed)
Pls cosign for [employer] inj../lmb  

## 2023-03-13 ENCOUNTER — Telehealth: Payer: Self-pay | Admitting: Internal Medicine

## 2023-03-13 NOTE — Telephone Encounter (Signed)
Patient called and said his C-Pap machine needs to be replaced. He wants to know if he needs to schedule an appointment or if that can be sent in for him. Best callback is [phone].

## 2023-03-13 NOTE — Telephone Encounter (Signed)
No C-pap on pt med list. Usually C-PAP are give by pulmonologist. Pt need to make appt w/ them if he does not have pulmonologist he need to see Dr. Ronnald Ramp.Marland KitchenJohny Chess

## 2023-04-24 ENCOUNTER — Ambulatory Visit: Payer: 59 | Admitting: Internal Medicine

## 2023-05-08 ENCOUNTER — Ambulatory Visit: Payer: 59 | Admitting: Internal Medicine

## 2023-05-08 ENCOUNTER — Encounter: Payer: Self-pay | Admitting: Internal Medicine

## 2023-05-08 ENCOUNTER — Ambulatory Visit (INDEPENDENT_AMBULATORY_CARE_PROVIDER_SITE_OTHER): Payer: 59

## 2023-05-08 VITALS — BP 118/78 | HR 60 | Temp 98.2°F | Ht 71.0 in | Wt 276.0 lb

## 2023-05-08 DIAGNOSIS — M25512 Pain in left shoulder: Secondary | ICD-10-CM

## 2023-05-08 DIAGNOSIS — G8929 Other chronic pain: Secondary | ICD-10-CM

## 2023-05-08 DIAGNOSIS — G4733 Obstructive sleep apnea (adult) (pediatric): Secondary | ICD-10-CM | POA: Diagnosis not present

## 2023-05-08 DIAGNOSIS — G992 Myelopathy in diseases classified elsewhere: Secondary | ICD-10-CM

## 2023-05-08 DIAGNOSIS — M4802 Spinal stenosis, cervical region: Secondary | ICD-10-CM

## 2023-05-08 DIAGNOSIS — M19012 Primary osteoarthritis, left shoulder: Secondary | ICD-10-CM | POA: Diagnosis not present

## 2023-05-08 DIAGNOSIS — R200 Anesthesia of skin: Secondary | ICD-10-CM | POA: Insufficient documentation

## 2023-05-08 NOTE — Progress Notes (Unsigned)
Subjective:  Patient ID: Geoffrey Evans, male    DOB: September 02, 1961  Age: 62 y.o. MRN: [id number]  CC: No chief complaint on file.   HPI Geoffrey Evans presents for ***  Outpatient Medications Prior to Visit  Medication Sig Dispense Refill   Ascorbic Acid (VITAMIN C PO) Take by mouth as needed. "WHEN FEEL A COLD COMING ON"     Multiple Vitamins-Minerals (MULTIVITAMIN WITH MINERALS) tablet Take 1 tablet by mouth daily.     vardenafil (LEVITRA) 10 MG tablet Take 1 tablet (10 mg total) by mouth daily as needed for erectile dysfunction. 10 tablet 5   levocetirizine (XYZAL) 5 MG tablet Take 1 tablet (5 mg total) by mouth every evening for 15 days. (Patient not taking: Reported on 08/12/2022) 15 tablet 0   triamcinolone cream (KENALOG) 0.1 % Apply 1 Application topically 2 (two) times daily. (Patient not taking: Reported on 01/24/2023) 30 g 0   No facility-administered medications prior to visit.    ROS Review of Systems  Objective:  BP 118/78 (BP Location: Left Arm, Patient Position: Sitting, Cuff Size: Large)   Pulse 60   Temp 98.2 F (36.8 C) (Oral)   Ht 5\' 11"  (1.803 m)   Wt 276 lb (125.2 kg)   SpO2 97%   BMI 38.49 kg/m   BP Readings from Last 3 Encounters:  05/08/23 118/78  02/22/23 120/69  11/30/22 130/76    Wt Readings from Last 3 Encounters:  05/08/23 276 lb (125.2 kg)  02/22/23 270 lb (122.5 kg)  01/24/23 270 lb (122.5 kg)    Physical Exam  Lab Results  Component Value Date   WBC 6.4 11/30/2022   HGB 15.5 11/30/2022   HCT 46.2 11/30/2022   PLT 223.0 11/30/2022   GLUCOSE 85 11/30/2022   CHOL 182 11/30/2022   TRIG 188.0 (H) 11/30/2022   HDL 37.60 (L) 11/30/2022   LDLDIRECT 116.0 09/23/2021   LDLCALC 106 (H) 11/30/2022   ALT 18 11/30/2022   AST 21 11/30/2022   NA 138 11/30/2022   K 4.5 11/30/2022   CL 102 11/30/2022   CREATININE 1.07 11/30/2022   BUN 17 11/30/2022   CO2 32 11/30/2022   TSH 1.38 11/30/2022   PSA 1.47 11/30/2022   HGBA1C 5.6  11/30/2022    DG Chest Portable 1 View  Result Date: 08/22/2021 CLINICAL DATA:  COVID positive with cough. EXAM: PORTABLE CHEST 1 VIEW COMPARISON:  None. FINDINGS: There is no evidence of acute infiltrate, pleural effusion or pneumothorax. The heart size and mediastinal contours are within normal limits. The visualized skeletal structures are unremarkable. IMPRESSION: No active cardiopulmonary disease. Electronically Signed   By: Aram Candela M.D.   On: 08/22/2021 23:50    DG Shoulder Left  Result Date: 05/08/2023 CLINICAL DATA:  Left shoulder pain for 5 months with occasional radiation to the left arm. No known injury. EXAM: LEFT SHOULDER - 2+ VIEW COMPARISON:  None Available. FINDINGS: The mineralization and alignment are normal. There is no evidence of acute fracture or dislocation. Mild acromioclavicular and glenohumeral degenerative changes. The subacromial space appears preserved. IMPRESSION: Mild degenerative changes. No acute osseous findings. Electronically Signed   By: Carey Bullocks M.D.   On: 05/08/2023 09:35   DG Cervical Spine Complete  Result Date: 05/08/2023 CLINICAL DATA:  Left shoulder pain for 5 months with occasional radiation to the left arm. No known injury. EXAM: CERVICAL SPINE - COMPLETE 4+ VIEW COMPARISON:  None Available. FINDINGS: The prevertebral soft tissues are normal. The  alignment is anatomic through T1. There is no evidence of acute fracture or traumatic subluxation. The C1-2 articulation appears normal in the AP projection. Mild multilevel spondylosis with disc space narrowing, uncinate spurring and facet hypertrophy, greatest at C5-6 and C6-7. Oblique views demonstrate mild foraminal narrowing, greatest on the right at C5-6. Ossification of the ligamentum nuchae noted. IMPRESSION: Cervical spondylosis with mild foraminal narrowing, greatest on the right at C5-6. No acute osseous findings or malalignment. Electronically Signed   By: Carey Bullocks M.D.   On:  05/08/2023 09:34     Assessment & Plan:   OSA (obstructive sleep apnea) -     Ambulatory referral to Pulmonology  Chronic left shoulder pain -     DG Shoulder Left; Future  Left arm numbness -     DG Cervical Spine Complete; Future     Follow-up: No follow-ups on file.  Sanda Linger, MD

## 2023-05-08 NOTE — Patient Instructions (Signed)
Shoulder Pain Many things can cause shoulder pain, including: An injury. Moving the shoulder in the same way again and again (overuse). Joint pain (arthritis). Pain can come from: Swelling and irritation (inflammation) of any part of the shoulder. An injury to: The shoulder joint. Tissues that connect muscle to bone (tendons). Tissues that connect bones to each other (ligaments). Bones. Follow these instructions at home: Watch for changes in your symptoms. Let your doctor know about them. Follow these instructions to help with your pain. If you have a sling that can be taken off: Wear the sling as told by your doctor. Take it off only as told by your doctor. Check the skin around the sling every day. Tell your doctor if you see problems. Loosen the sling if your fingers: Tingle. Become numb. Become cold. Keep the sling clean. If the sling is not waterproof: Do not let it get wet. Take the sling off when you shower or bathe. Managing pain, stiffness, and swelling  If told, put ice on the painful area. Put ice in a plastic bag. Place a towel between your skin and the bag. Leave the ice on for 20 minutes, 2-3 times a day. Stop putting ice on if it does not help with the pain. If your skin turns bright red, take off the ice right away to prevent skin damage. The risk of damage is higher if you cannot feel pain, heat, or cold. Squeeze a soft ball or a foam pad as much as possible. This prevents swelling in the shoulder. It also helps to strengthen the arm. General instructions Take over-the-counter and prescription medicines only as told by your doctor. Keep all follow-up visits. This will help you avoid any type of permanent shoulder problems. Contact a doctor if: Your pain gets worse. Medicine does not help your pain. You have new pain in your arm, hand, or fingers. You loosen your sling and your arm, hand, or fingers: Tingle. Are numb. Are swollen. Get help right away  if: Your arm, hand, or fingers turn white or blue. This information is not intended to replace advice given to you by your health care provider. Make sure you discuss any questions you have with your health care provider. Document Revised: 07/15/2022 Document Reviewed: 07/15/2022 Elsevier Patient Education  2023 Elsevier Inc.  

## 2023-05-11 ENCOUNTER — Other Ambulatory Visit: Payer: Self-pay | Admitting: Internal Medicine

## 2023-05-11 DIAGNOSIS — R200 Anesthesia of skin: Secondary | ICD-10-CM

## 2023-05-11 DIAGNOSIS — M4712 Other spondylosis with myelopathy, cervical region: Secondary | ICD-10-CM | POA: Insufficient documentation

## 2023-05-14 ENCOUNTER — Other Ambulatory Visit: Payer: 59

## 2023-05-15 ENCOUNTER — Telehealth: Payer: Self-pay | Admitting: *Deleted

## 2023-05-15 NOTE — Telephone Encounter (Signed)
Called and spoke with patient, he states there is not an SD card in the machine, however, he has spoken with [facility] and they told him he just needed a referral to [place] to get a new machine.  His last sleep study is in the machine.  His last download is from >1 year ago.  Nothing further needed.

## 2023-05-16 ENCOUNTER — Ambulatory Visit (INDEPENDENT_AMBULATORY_CARE_PROVIDER_SITE_OTHER): Payer: 59 | Admitting: Family Medicine

## 2023-05-16 ENCOUNTER — Other Ambulatory Visit: Payer: Self-pay

## 2023-05-16 ENCOUNTER — Encounter: Payer: Self-pay | Admitting: Adult Health

## 2023-05-16 ENCOUNTER — Ambulatory Visit (INDEPENDENT_AMBULATORY_CARE_PROVIDER_SITE_OTHER): Payer: 59 | Admitting: Adult Health

## 2023-05-16 VITALS — BP 138/76 | HR 68 | Ht 71.0 in | Wt 274.0 lb

## 2023-05-16 VITALS — BP 120/70 | HR 74 | Ht 71.0 in | Wt 274.8 lb

## 2023-05-16 DIAGNOSIS — Z6838 Body mass index (BMI) 38.0-38.9, adult: Secondary | ICD-10-CM | POA: Diagnosis not present

## 2023-05-16 DIAGNOSIS — M25512 Pain in left shoulder: Secondary | ICD-10-CM | POA: Diagnosis not present

## 2023-05-16 DIAGNOSIS — G4733 Obstructive sleep apnea (adult) (pediatric): Secondary | ICD-10-CM

## 2023-05-16 DIAGNOSIS — G8929 Other chronic pain: Secondary | ICD-10-CM | POA: Diagnosis not present

## 2023-05-16 NOTE — Progress Notes (Signed)
@Patient  ID: Geoffrey Evans, male    DOB: 09/25/1961, 62 y.o.   MRN: [id number]  Chief Complaint  Patient presents with   Consult    Referring provider: Etta Grandchild, MD  HPI: 62 year old male seen for sleep consult May 16, 2023 to establish for sleep apnea.  Diagnosed originally with sleep apnea around 2009 has been on CPAP since then Former patient of Dr. Reginia Naas last seen in 2017  TEST/EVENTS :  HST 03/2016 >> mild sleep apnea - AHI: 5.7/hr.   05/16/2023 Sleep consult  Patient presents for sleep consult to reestablish for sleep apnea.  Patient has had longstanding sleep apnea since 2009 when he was originally diagnosed.  Has been on CPAP since diagnosis.  Patient says he cannot sleep without his CPAP.  Says that it makes a huge difference in his quality of life.  Since starting on CPAP he has decreased daytime sleepiness and feels that he benefits from CPAP.  Patient says his CPAP machine is around 61 years old.  Recently has come up with an error message saying that his motor life is reached life expectancy.  He is requesting a new machine.  Patient says he gets his supplies from [place].  Has had no issues.  Usually gets in about 7 to 8 hours of CPAP usage each night.  Uses a nasal mask.  Patient says weight is down about 10 pounds current weight is at 274 pounds with a BMI at 38.  Has no history of congestive heart failure or stroke.  Does not use any sleep aids.  Has no removable dental work.  No symptoms suspicious for cataplexy or sleep paralysis.  Typically gets in about 3 cups of coffee daily.  Typically goes to bed about 10 PM.  Takes only a few minutes to go to sleep.  Is up at 7 AM.  Epworth score is 6 out of 24.  Does get sleepy in the evening hours.  Unable to get CPAP download as patient does not have an SD card.  CPAP download has been requested from [facility].  Social history patient is married.  Lives at home with his wife and son.  Works for [employer].  Does travel  extensively.  Patient is a never smoker.  Social alcohol.  No drug use.  Past Surgical History:  Procedure Laterality Date   COLONOSCOPY  04/2014   X 2    FH: Heart failure, colon polyps, diabetes  Medical history   cervical spondylosis, allergic dermatitis, osteoarthritis left shoulder, erectile dysfunction, seasonal allergies   No Known Allergies  Immunization History  Administered Date(s) Administered   Influenza Inj Mdck Quad With Preservative 10/04/2022   Influenza,inj,Quad PF,6+ Mos 09/15/2018, 09/21/2019, 09/23/2021   Influenza-Unspecified 09/26/2015, 09/18/2016, 09/16/2020   PFIZER(Purple Top)SARS-COV-2 Vaccination 02/24/2020, 03/24/2020   Tdap 07/11/2012, 11/30/2022   Zoster Recombinat (Shingrix) 11/30/2022, 03/06/2023    Past Medical History:  Diagnosis Date   OSA (obstructive sleep apnea) 2010   Seasonal allergies    Sleep apnea    WEAR C PAP    Tobacco History: Social History   Tobacco Use  Smoking Status Never  Smokeless Tobacco Former   Types: Chew   Quit date: 03/23/1990  Tobacco Comments   USE NICORETE CHEWING GUM AT TIMES   Counseling given: Not Answered Tobacco comments: USE NICORETE CHEWING GUM AT TIMES   Outpatient Medications Prior to Visit  Medication Sig Dispense Refill   Ascorbic Acid (VITAMIN C PO) Take by mouth as  needed. "WHEN FEEL A COLD COMING ON"     Multiple Vitamins-Minerals (MULTIVITAMIN WITH MINERALS) tablet Take 1 tablet by mouth daily.     vardenafil (LEVITRA) 10 MG tablet Take 1 tablet (10 mg total) by mouth daily as needed for erectile dysfunction. 10 tablet 5   No facility-administered medications prior to visit.     Review of Systems:   Constitutional:   No  weight loss, night sweats,  Fevers, chills, fatigue, or  lassitude.  HEENT:   No headaches,  Difficulty swallowing,  Tooth/dental problems, or  Sore throat,                No sneezing, itching, ear ache, nasal congestion, post nasal drip,   CV:  No chest  pain,  Orthopnea, PND, swelling in lower extremities, anasarca, dizziness, palpitations, syncope.   GI  No heartburn, indigestion, abdominal pain, nausea, vomiting, diarrhea, change in bowel habits, loss of appetite, bloody stools.   Resp: No shortness of breath with exertion or at rest.  No excess mucus, no productive cough,  No non-productive cough,  No coughing up of blood.  No change in color of mucus.  No wheezing.  No chest wall deformity  Skin: no rash or lesions.  GU: no dysuria, change in color of urine, no urgency or frequency.  No flank pain, no hematuria   MS:  No joint pain or swelling.  No decreased range of motion.  No back pain.    Physical Exam  BP 120/70 (BP Location: Left Arm, Patient Position: Sitting, Cuff Size: Large)   Pulse 74   Ht 5\' 11"  (1.803 m)   Wt 274 lb 12.8 oz (124.6 kg)   SpO2 99%   BMI 38.33 kg/m   GEN: A/Ox3; pleasant , NAD, well nourished    HEENT:  [place]/AT, NOSE-clear, THROAT-clear, no lesions, no postnasal drip or exudate noted.  Class IV MP airway  NECK:  Supple w/ fair ROM; no JVD; normal carotid impulses w/o bruits; no thyromegaly or nodules palpated; no lymphadenopathy.    RESP  Clear  P & A; w/o, wheezes/ rales/ or rhonchi. no accessory muscle use, no dullness to percussion  CARD:  RRR, no m/r/g, no peripheral edema, pulses intact, no cyanosis or clubbing.  GI:   Soft & nt; nml bowel sounds; no organomegaly or masses detected.   Musco: Warm bil, no deformities or joint swelling noted.   Neuro: alert, no focal deficits noted.    Skin: Warm, no lesions or rashes    Lab Results:   BNP No results found for: "BNP"  ProBNP No results found for: "PROBNP"  Imaging: DG Shoulder Left  Result Date: 05/08/2023 CLINICAL DATA:  Left shoulder pain for 5 months with occasional radiation to the left arm. No known injury. EXAM: LEFT SHOULDER - 2+ VIEW COMPARISON:  None Available. FINDINGS: The mineralization and alignment are normal.  There is no evidence of acute fracture or dislocation. Mild acromioclavicular and glenohumeral degenerative changes. The subacromial space appears preserved. IMPRESSION: Mild degenerative changes. No acute osseous findings. Electronically Signed   By: Carey Bullocks M.D.   On: 05/08/2023 09:35   DG Cervical Spine Complete  Result Date: 05/08/2023 CLINICAL DATA:  Left shoulder pain for 5 months with occasional radiation to the left arm. No known injury. EXAM: CERVICAL SPINE - COMPLETE 4+ VIEW COMPARISON:  None Available. FINDINGS: The prevertebral soft tissues are normal. The alignment is anatomic through T1. There is no evidence of acute fracture or traumatic  subluxation. The C1-2 articulation appears normal in the AP projection. Mild multilevel spondylosis with disc space narrowing, uncinate spurring and facet hypertrophy, greatest at C5-6 and C6-7. Oblique views demonstrate mild foraminal narrowing, greatest on the right at C5-6. Ossification of the ligamentum nuchae noted. IMPRESSION: Cervical spondylosis with mild foraminal narrowing, greatest on the right at C5-6. No acute osseous findings or malalignment. Electronically Signed   By: Carey Bullocks M.D.   On: 05/08/2023 09:34          No data to display          No results found for: "NITRICOXIDE"      Assessment & Plan:   OSA (obstructive sleep apnea) Appears to be doing well on CPAP.  Needs new CPAP order.  Patient education given on sleep apnea and CPAP care.  CPAP download has been requested.  Order for new CPAP for same settings.  - discussed how weight can impact sleep and risk for sleep disordered breathing - discussed options to assist with weight loss: combination of diet modification, cardiovascular and strength training exercises   - had an extensive discussion regarding the adverse health consequences related to untreated sleep disordered breathing - specifically discussed the risks for hypertension, coronary artery  disease, cardiac dysrhythmias, cerebrovascular disease, and diabetes - lifestyle modification discussed   - discussed how sleep disruption can increase risk of accidents, particularly when driving - safe driving practices were discussed   Plan  Patient Instructions  Order for new CPAP and SD card  CPAP download.  Continue on CPAP At bedtime, wear all night long  Keep up good work Do not drive if sleepy  Work on healthy weight loss.  Follow up with Dr. Vassie Loll  in 1 year and As needed        Class 2 severe obesity due to excess calories with serious comorbidity and body mass index (BMI) of 38.0 to 38.9 in adult [facility]) Continue with healthy weight loss     Rubye Oaks, NP 05/16/2023

## 2023-05-16 NOTE — Assessment & Plan Note (Signed)
Appears to be doing well on CPAP.  Needs new CPAP order.  Patient education given on sleep apnea and CPAP care.  CPAP download has been requested.  Order for new CPAP for same settings.  - discussed how weight can impact sleep and risk for sleep disordered breathing - discussed options to assist with weight loss: combination of diet modification, cardiovascular and strength training exercises   - had an extensive discussion regarding the adverse health consequences related to untreated sleep disordered breathing - specifically discussed the risks for hypertension, coronary artery disease, cardiac dysrhythmias, cerebrovascular disease, and diabetes - lifestyle modification discussed   - discussed how sleep disruption can increase risk of accidents, particularly when driving - safe driving practices were discussed   Plan  Patient Instructions  Order for new CPAP and SD card  CPAP download.  Continue on CPAP At bedtime, wear all night long  Keep up good work Do not drive if sleepy  Work on healthy weight loss.  Follow up with Dr. Vassie Loll  in 1 year and As needed

## 2023-05-16 NOTE — Progress Notes (Signed)
Rubin Payor, PhD, LAT, ATC acting as a scribe for Clementeen Graham, MD.  Geoffrey Evans is a 62 y.o. male who presents to [facility] today for neck and L arm pain ongoing for about 37-months, worsening. Pt locates pain to the superior aspect of the L shoulder and sometimes into L trapz-neck region. He c/o pain waking him up at night. He is R-hand dominate.  He does not have much numbness or tingling into his arm.  Radiates: yes- into upper arm UE numbness/tingling: no UE weakness: no Aggravates: shoulder aBd, IR, Overhead  Treatments tried: [employer], modify activity  Dx imaging: 05/08/23 L shoulder & c-spine XR  Pertinent review of systems: No fevers or chills  Relevant historical information: Sleep apnea.  Works for the [employer], [employer]   Exam:  BP 138/76   Pulse 68   Ht 5\' 11"  (1.803 m)   Wt 274 lb (124.3 kg)   SpO2 97%   BMI 38.22 kg/m  General: Well Developed, well nourished, and in no acute distress.   MSK: C-spine: Normal appearing Nontender. Left shoulder: Normal-appearing Decreased range of motion abduction limited to 120 degrees.  External rotation 20 degrees.  Neutral position and functional internal rotation to posterior iliac crest. Strength is intact within limits of motion. Positive Hawkins and Neer's test.    Lab and Radiology Results  Procedure: Real-time Ultrasound Guided Injection of left shoulder glenohumeral joint posterior approach Device: Philips Affiniti 50G Images permanently stored and available for review in PACS Ultrasound evaluation prior to injection shows intact rotator cuff tendons without significant subacromial bursitis. Verbal informed consent obtained.  Discussed risks and benefits of procedure. Warned about infection, bleeding, hyperglycemia damage to structures among others. Patient expresses understanding and agreement Time-out conducted.   Noted no overlying erythema, induration,  or other signs of local infection.   Skin prepped in a sterile fashion.   Local anesthesia: Topical Ethyl chloride.   With sterile technique and under real time ultrasound guidance: 40 mg of Kenalog and 2 mL Marcaine injected into glenohumeral joint. Fluid seen entering the joint capsule.   Completed without difficulty   Pain moderately immediately resolved suggesting accurate placement of the medication.   Advised to call if fevers/chills, erythema, induration, drainage, or persistent bleeding.   Images permanently stored and available for review in the ultrasound unit.  Impression: Technically successful ultrasound guided injection.    DG Shoulder Left  Result Date: 05/08/2023 CLINICAL DATA:  Left shoulder pain for 5 months with occasional radiation to the left arm. No known injury. EXAM: LEFT SHOULDER - 2+ VIEW COMPARISON:  None Available. FINDINGS: The mineralization and alignment are normal. There is no evidence of acute fracture or dislocation. Mild acromioclavicular and glenohumeral degenerative changes. The subacromial space appears preserved. IMPRESSION: Mild degenerative changes. No acute osseous findings. Electronically Signed   By: Carey Bullocks M.D.   On: 05/08/2023 09:35   DG Cervical Spine Complete  Result Date: 05/08/2023 CLINICAL DATA:  Left shoulder pain for 5 months with occasional radiation to the left arm. No known injury. EXAM: CERVICAL SPINE - COMPLETE 4+ VIEW COMPARISON:  None Available. FINDINGS: The prevertebral soft tissues are normal. The alignment is anatomic through T1. There is no evidence of acute fracture or traumatic subluxation. The C1-2 articulation appears normal in the AP projection. Mild multilevel spondylosis with disc space narrowing, uncinate spurring and facet hypertrophy, greatest at C5-6 and C6-7. Oblique views demonstrate mild foraminal narrowing, greatest  on the right at C5-6. Ossification of the ligamentum nuchae noted. IMPRESSION: Cervical  spondylosis with mild foraminal narrowing, greatest on the right at C5-6. No acute osseous findings or malalignment. Electronically Signed   By: Carey Bullocks M.D.   On: 05/08/2023 09:34   I, Clementeen Graham, personally (independently) visualized and performed the interpretation of the images attached in this note.     Assessment and Plan: 62 y.o. male with left shoulder pain and lack of range of motion thought to be due to adhesive capsulitis.  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  Plan for physical therapy referral to work on range of motion.  Glenohumeral injection performed today.  Differential does include cervical radiculopathy which is less likely.  Plan on rechecking in 6 weeks.  If not better consider MRI.   PDMP not reviewed this encounter. Orders Placed This Encounter  Procedures   [place] LIMITED JOINT SPACE STRUCTURES UP LEFT(NO LINKED CHARGES)    Order Specific Question:   Reason for Exam (SYMPTOM  OR DIAGNOSIS REQUIRED)    Answer:   left shoulder pain    Order Specific Question:   Preferred imaging location?    Answer:   [facility] referral to Physical Therapy    Referral Priority:   Routine    Referral Type:   Physical Medicine    Referral Reason:   Specialty Services Required    Requested Specialty:   Physical Therapy    Number of Visits Requested:   1   No orders of the defined types were placed in this encounter.    Discussed warning signs or symptoms. Please see discharge instructions. Patient expresses understanding.   The above documentation has been reviewed and is accurate and complete Clementeen Graham, M.D.

## 2023-05-16 NOTE — Patient Instructions (Signed)
Order for new CPAP and SD card  CPAP download.  Continue on CPAP At bedtime, wear all night long  Keep up good work Do not drive if sleepy  Work on healthy weight loss.  Follow up with Dr. Vassie Loll  in 1 year and As needed

## 2023-05-16 NOTE — Patient Instructions (Addendum)
Thank you for coming in today.   You received an injection today. Seek immediate medical attention if the joint becomes red, extremely painful, or is oozing fluid.   I've referred you to Physical Therapy.  Let us know if you don't hear from them in one week.   Check back in 6 weeks 

## 2023-05-16 NOTE — Addendum Note (Signed)
Addended by: Delrae Rend on: 05/16/2023 09:28 AM   Modules accepted: Orders

## 2023-05-16 NOTE — Assessment & Plan Note (Signed)
Continue with healthy weight loss 

## 2023-05-29 ENCOUNTER — Ambulatory Visit (INDEPENDENT_AMBULATORY_CARE_PROVIDER_SITE_OTHER): Payer: 59 | Admitting: Physical Therapy

## 2023-05-29 ENCOUNTER — Encounter: Payer: Self-pay | Admitting: Physical Therapy

## 2023-05-29 DIAGNOSIS — M25612 Stiffness of left shoulder, not elsewhere classified: Secondary | ICD-10-CM

## 2023-05-29 DIAGNOSIS — M25512 Pain in left shoulder: Secondary | ICD-10-CM | POA: Diagnosis not present

## 2023-05-29 NOTE — Therapy (Signed)
OUTPATIENT PHYSICAL THERAPY UPPER EXTREMITY EVALUATION   Patient Name: Geoffrey Evans MRN: [id number] DOB:Dec 21, 1961, 62 y.o., male Today's Date: 05/29/2023  END OF SESSION:   Past Medical History:  Diagnosis Date   OSA (obstructive sleep apnea) 2010   Seasonal allergies    Sleep apnea    WEAR C PAP   Past Surgical History:  Procedure Laterality Date   COLONOSCOPY  04/2014   X 2   Patient Active Problem List   Diagnosis Date Noted   Cervical spondylosis with myelopathy 05/11/2023   Chronic left shoulder pain 05/08/2023   Left arm numbness 05/08/2023   Primary osteoarthritis, left shoulder 05/08/2023   Pure hyperglyceridemia 11/30/2022   Class 2 severe obesity due to excess calories with serious comorbidity and body mass index (BMI) of 38.0 to 38.9 in adult [facility]) 11/30/2022   Allergic dermatitis 10/08/2021   Bilateral hearing loss 04/16/2020   Erectile dysfunction due to diseases classified elsewhere 04/16/2020   Colon polyps 04/13/2017   Routine general medical examination at a health care facility 12/07/2015   OSA (obstructive sleep apnea) 12/07/2015    PCP: Sanda Linger.  REFERRING PROVIDER: Clementeen Graham  REFERRING DIAG: L shoulder pain   THERAPY DIAG:  No diagnosis found.  Rationale for Evaluation and Treatment: Rehabilitation  ONSET DATE:    SUBJECTIVE:                                                                                                                                                                                      SUBJECTIVE STATEMENT:  L shoulder Pain- injection-  Pain in upper arm, elevation, reaching out to side, and behind the back. Pt works full time, Psychologist, forensic- teaches mostly, walking,  Active at home-   Hand dominance: Right  PERTINENT HISTORY: none    PAIN:  Are you having pain? Yes: NPRS scale: 4/10 Pain location: L shoulder  Pain description: sore, throbbing.  Aggravating factors: with certain motions,   Relieving factors: none stated   PRECAUTIONS: None  WEIGHT BEARING RESTRICTIONS: No  FALLS:  Has patient fallen in last 6 months? No   PLOF: Independent  PATIENT GOALS: decreased pain, improved movement of shoulder.   NEXT MD VISIT: ***  OBJECTIVE:   DIAGNOSTIC FINDINGS:  ***  PATIENT SURVEYS :  FOTO- 57.3   COGNITION: Overall cognitive status: Within functional limits for tasks assessed     POSTURE: Mild fwd head, rounded shoulders   UPPER EXTREMITY ROM:   Active /Passive ROM Right eval Left eval  Shoulder flexion A  145 A 120 , P 130  Shoulder extension    Shoulder abduction  A 100,  P  Shoulder adduction    Shoulder internal rotation A L1 Belt line  Shoulder external rotation    Elbow flexion    Elbow extension    Wrist flexion    Wrist extension    Wrist ulnar deviation    Wrist radial deviation    Wrist pronation    Wrist supination    (Blank rows = not tested)  UPPER EXTREMITY MMT:  MMT Right eval Left eval  Shoulder flexion    Shoulder extension    Shoulder abduction    Shoulder adduction    Shoulder internal rotation    Shoulder external rotation    Middle trapezius    Lower trapezius    Elbow flexion    Elbow extension    Wrist flexion    Wrist extension    Wrist ulnar deviation    Wrist radial deviation    Wrist pronation    Wrist supination    Grip strength (lbs)    (Blank rows = not tested)  SHOULDER SPECIAL TESTS: Impingement tests: {shoulder impingement test:25231:a} SLAP lesions: {SLAP lesions:25232} Instability tests: {shoulder instability test:25233} Rotator cuff assessment: {rotator cuff assessment:25234} Biceps assessment: {biceps assessment:25235}  JOINT MOBILITY TESTING:  ***  PALPATION:  ***   TODAY'S TREATMENT:                                                                                                                                         DATE:   05/29/2023 Ther ex: see below for HEP   PATIENT  EDUCATION:  Education details: PT POC, Exam findings, HEP Person educated: Patient Education method: Explanation, Demonstration, Tactile cues, Verbal cues, and Handouts Education comprehension: verbalized understanding, returned demonstration, verbal cues required, tactile cues required, and needs further education   HOME EXERCISE PROGRAM:   ASSESSMENT:  CLINICAL IMPRESSION: Patient is a *** y.o. *** who was seen today for physical therapy evaluation and treatment for ***.    OBJECTIVE IMPAIRMENTS: {opptimpairments:25111}.   ACTIVITY LIMITATIONS: {activitylimitations:27494}  PARTICIPATION LIMITATIONS: {participationrestrictions:25113}  PERSONAL FACTORS:  none  are also affecting patient's functional outcome.   REHAB POTENTIAL: Good  CLINICAL DECISION MAKING: Stable/uncomplicated  EVALUATION COMPLEXITY: Low  GOALS: Goals reviewed with patient? {yes/no:20286}  SHORT TERM GOALS: Target date: 06/12/2023    *** Baseline: Goal status: {GOALSTATUS:25110}  2.  *** Baseline:  Goal status: {GOALSTATUS:25110}  3.  *** Baseline:  Goal status: {GOALSTATUS:25110}  4.  *** Baseline:  Goal status: {GOALSTATUS:25110}  5.  *** Baseline:  Goal status: {GOALSTATUS:25110}  6.  *** Baseline:  Goal status: {GOALSTATUS:25110}    LONG TERM GOALS: Target date: 07/24/2023   *** Baseline:  Goal status: {GOALSTATUS:25110}  2.  *** Baseline:  Goal status: {GOALSTATUS:25110}  3.  *** Baseline:  Goal status: {GOALSTATUS:25110}  4.  *** Baseline:  Goal status: {GOALSTATUS:25110}  5.  *** Baseline:  Goal status: {GOALSTATUS:25110}  6.  *** Baseline:  Goal status: {  GOALSTATUS:25110}  PLAN: PT FREQUENCY: 1-2x/week  PT DURATION: 8 weeks  PLANNED INTERVENTIONS: Therapeutic exercises, Therapeutic activity, Neuromuscular re-education, Patient/Family education, Self Care, Joint mobilization, Joint manipulation, Stair training, Orthotic/Fit training, DME  instructions, Aquatic Therapy, Dry Needling, Electrical stimulation, Cryotherapy, Moist heat, Taping, Ultrasound, Ionotophoresis 4mg /ml Dexamethasone, Manual therapy,  Vasopneumatic device, Traction, Spinal manipulation, Spinal mobilization,Balance training, Gait training,   PLAN FOR NEXT SESSION: ***   Sedalia Muta, PT, DPT 8:55 AM  05/29/23

## 2023-06-06 ENCOUNTER — Encounter: Payer: Self-pay | Admitting: Physical Therapy

## 2023-06-06 ENCOUNTER — Ambulatory Visit (INDEPENDENT_AMBULATORY_CARE_PROVIDER_SITE_OTHER): Payer: 59 | Admitting: Physical Therapy

## 2023-06-06 DIAGNOSIS — M25512 Pain in left shoulder: Secondary | ICD-10-CM | POA: Diagnosis not present

## 2023-06-06 DIAGNOSIS — M25612 Stiffness of left shoulder, not elsewhere classified: Secondary | ICD-10-CM

## 2023-06-06 NOTE — Therapy (Signed)
OUTPATIENT PHYSICAL THERAPY UPPER EXTREMITY TREATMENT   Patient Name: Geoffrey Evans MRN: [id number] DOB:1961-03-11, 62 y.o., male Today's Date: 06/06/2023  END OF SESSION:  PT End of Session - 06/06/23 1214     Visit Number 2    Number of Visits 16    Date for PT Re-Evaluation 07/24/23    Authorization Type [employer]    PT Start Time 1215    PT Stop Time 1253    PT Time Calculation (min) 38 min    Activity Tolerance Patient tolerated treatment well    Behavior During Therapy WFL for tasks assessed/performed             Past Medical History:  Diagnosis Date   OSA (obstructive sleep apnea) 2010   Seasonal allergies    Sleep apnea    WEAR C PAP   Past Surgical History:  Procedure Laterality Date   COLONOSCOPY  04/2014   X 2   Patient Active Problem List   Diagnosis Date Noted   Cervical spondylosis with myelopathy 05/11/2023   Chronic left shoulder pain 05/08/2023   Left arm numbness 05/08/2023   Primary osteoarthritis, left shoulder 05/08/2023   Pure hyperglyceridemia 11/30/2022   Class 2 severe obesity due to excess calories with serious comorbidity and body mass index (BMI) of 38.0 to 38.9 in adult [facility]) 11/30/2022   Allergic dermatitis 10/08/2021   Bilateral hearing loss 04/16/2020   Erectile dysfunction due to diseases classified elsewhere 04/16/2020   Colon polyps 04/13/2017   Routine general medical examination at a health care facility 12/07/2015   OSA (obstructive sleep apnea) 12/07/2015    PCP: Sanda Linger.  REFERRING PROVIDER: Clementeen Graham  REFERRING DIAG: L shoulder pain   THERAPY DIAG:  Acute pain of left shoulder  Stiffness of joint, shoulder region, left  Rationale for Evaluation and Treatment: Rehabilitation  ONSET DATE:    SUBJECTIVE:                                                                                                                                                                                      SUBJECTIVE  STATEMENT: Pt with no new complaints.   Eval: L shoulder Pain, has Pain in upper arm, mostly with elevation, reaching out to side, and behind the back.He got injection recently that has helped some.   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 Yes: NPRS scale: 4/10 Pain location: L shoulder  Pain description: sore, throbbing.  Aggravating factors: with certain motions,  Relieving factors: none stated  PRECAUTIONS: None  WEIGHT BEARING RESTRICTIONS: No  FALLS:  Has patient fallen in last 6 months? No   PLOF: Independent  PATIENT GOALS: decreased pain, improved movement of shoulder.   NEXT MD VISIT:   OBJECTIVE:   DIAGNOSTIC FINDINGS:    PATIENT SURVEYS :  FOTO- 57.3   COGNITION: Overall cognitive status: Within functional limits for tasks assessed     POSTURE: Mild fwd head, rounded shoulders   UPPER EXTREMITY ROM:   Active /Passive ROM Right eval Left eval Left 06/06/23  Shoulder flexion A  145 A 120 , P 130 P: 145  Shoulder extension     Shoulder abduction  A 100,  P120   Shoulder adduction     Shoulder internal rotation A L1 Belt line   Shoulder external rotation  45   Elbow flexion     Elbow extension     Wrist flexion     Wrist extension     Wrist ulnar deviation     Wrist radial deviation     Wrist pronation     Wrist supination     (Blank rows = not tested)  UPPER EXTREMITY MMT:  MMT Right eval Left eval  Shoulder flexion  3-  Shoulder extension    Shoulder abduction  3-  Shoulder adduction    Shoulder internal rotation  4  Shoulder external rotation  4  Middle trapezius    Lower trapezius    Elbow flexion    Elbow extension    Wrist flexion    Wrist extension    Wrist ulnar deviation    Wrist radial deviation    Wrist pronation    Wrist supination    Grip strength (lbs)    (Blank rows = not tested)   JOINT  MOBILITY TESTING:  Hypomobile R GHJ    TODAY'S TREATMENT:                                                                                                                                         DATE:   06/06/2023  Therapeutic Exercise: Aerobic: Supine: Shoulder flexion/AAROM/cane 2 x 10;  cane/ER x 15;  shoulder ER butterfly x 10;  Supine shoulder AROM x 10; S/L: shoulder ER 2 x 10;   Seated:  Pulley flexion x 2 min;  Standing: Shoulder ER ROM x 15,  with RTB x 15; wall slides 1 UE x 15 ;  Stretches:  Neuromuscular Re-education: Manual Therapy: GHJ mobs for L shoulder, PROM all motions.     PATIENT EDUCATION:  Education details: PT POC, Exam findings, HEP Person educated: Patient Education method: Explanation, Demonstration, Tactile cues, Verbal cues, and Handouts Education comprehension: verbalized understanding, returned demonstration, verbal cues required, tactile cues required, and needs further education   HOME EXERCISE PROGRAM: Access Code: [email or web address]   ASSESSMENT:  CLINICAL IMPRESSION: Patient presents with primary complaint of increased pain in L shoulder.  He has joint stiffness that is limiting ability for full ROM. He has decreased strength and decreased ability for full functional activities due to deficits. Pt to benefit from skilled PT to improve deficits and pain.    OBJECTIVE IMPAIRMENTS: decreased activity tolerance, decreased mobility, decreased ROM, decreased strength, hypomobility, impaired UE functional use, improper body mechanics, and pain.   ACTIVITY LIMITATIONS: carrying, lifting, sleeping, dressing, hygiene/grooming, and locomotion level  PARTICIPATION LIMITATIONS: meal prep, cleaning, laundry, shopping, community activity, and yard work  PERSONAL FACTORS:  none  are also affecting patient's functional outcome.   REHAB POTENTIAL: Good  CLINICAL DECISION MAKING: Stable/uncomplicated  EVALUATION COMPLEXITY: Low  GOALS: Goals reviewed with  patient? Yes  SHORT TERM GOALS: Target date: 06/12/2023   SHORT TERM GOALS:   Patient will be independent with initial HEP   Goal status: INITIAL  2.  Pt to demo improved ROM of shoulder by at least 10 deg for flexion    Goal status: INITIAL   LONG TERM GOALS:    LONG TERM GOALS: Target date: 07/24/2023  Patient will be independent with final HEP   Goal status: INITIAL  2.  Pt to report decreased pain in shoulder to 0-2/10 with reaching, lifting, carrying to improve ability for IADLs.     Goal status: INITIAL   3.  Pt to demonstrate improved strength of shoulder to be at least 4+/5, to improve ability for lift, carry and overhead activity.    Goal status: INITIAL   4.  Pt to demo AROM to be [facility] for improved ability for ADLs and IADLs.    Goal status: INITIAL     PLAN: PT FREQUENCY: 1-2x/week  PT DURATION: 8 weeks  PLANNED INTERVENTIONS: Therapeutic exercises, Therapeutic activity, Neuromuscular re-education, Patient/Family education, Self Care, Joint mobilization, Joint manipulation, Stair training, Orthotic/Fit training, DME instructions, Aquatic Therapy, Dry Needling, Electrical stimulation, Cryotherapy, Moist heat, Taping, Ultrasound, Ionotophoresis 4mg /ml Dexamethasone, Manual therapy,  Vasopneumatic device, Traction, Spinal manipulation, Spinal mobilization,Balance training, Gait training,   PLAN FOR NEXT SESSION:  IR behind back.    Sedalia Muta, PT, DPT 12:56 PM  06/06/23

## 2023-06-09 ENCOUNTER — Encounter: Payer: 59 | Admitting: Physical Therapy

## 2023-06-14 ENCOUNTER — Ambulatory Visit (INDEPENDENT_AMBULATORY_CARE_PROVIDER_SITE_OTHER): Payer: 59 | Admitting: Physical Therapy

## 2023-06-14 ENCOUNTER — Encounter: Payer: Self-pay | Admitting: Physical Therapy

## 2023-06-14 DIAGNOSIS — M25612 Stiffness of left shoulder, not elsewhere classified: Secondary | ICD-10-CM

## 2023-06-14 DIAGNOSIS — M25512 Pain in left shoulder: Secondary | ICD-10-CM | POA: Diagnosis not present

## 2023-06-14 NOTE — Therapy (Signed)
OUTPATIENT PHYSICAL THERAPY UPPER EXTREMITY TREATMENT   Patient Name: Geoffrey Evans MRN: [id number] DOB:[birth year], 62 y.o., male Today's Date: 06/14/2023  END OF SESSION:  PT End of Session - 06/14/23 0851     Visit Number 3    Number of Visits 16    Date for PT Re-Evaluation 07/24/23    Authorization Type [employer]    PT Start Time 0803    PT Stop Time 0847    PT Time Calculation (min) 44 min    Activity Tolerance Patient tolerated treatment well    Behavior During Therapy [facility] for tasks assessed/performed              Past Medical History:  Diagnosis Date   OSA (obstructive sleep apnea) 2010   Seasonal allergies    Sleep apnea    WEAR C PAP   Past Surgical History:  Procedure Laterality Date   COLONOSCOPY  04/2014   X 2   Patient Active Problem List   Diagnosis Date Noted   Cervical spondylosis with myelopathy 05/11/2023   Chronic left shoulder pain 05/08/2023   Left arm numbness 05/08/2023   Primary osteoarthritis, left shoulder 05/08/2023   Pure hyperglyceridemia 11/30/2022   Class 2 severe obesity due to excess calories with serious comorbidity and body mass index (BMI) of 38.0 to 38.9 in adult [facility]) 11/30/2022   Allergic dermatitis 10/08/2021   Bilateral hearing loss 04/16/2020   Erectile dysfunction due to diseases classified elsewhere 04/16/2020   Colon polyps 04/13/2017   Routine general medical examination at a health care facility 12/07/2015   OSA (obstructive sleep apnea) 12/07/2015    PCP: Sanda Linger.  REFERRING PROVIDER: Clementeen Graham  REFERRING DIAG: L shoulder pain   THERAPY DIAG:  Acute pain of left shoulder  Stiffness of joint, shoulder region, left  Rationale for Evaluation and Treatment: Rehabilitation  ONSET DATE:    SUBJECTIVE:                                                                                                                                                                                      SUBJECTIVE  STATEMENT: Pt states mild increase in soreness in last couple days, was moving/lifting lots of branches and doing yard work.   Eval: L shoulder Pain, has Pain in upper arm, mostly with elevation, reaching out to side, and behind the back.He got injection recently that has helped some.   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 Yes: NPRS scale: 4/10 Pain location: L shoulder  Pain  description: sore, throbbing.  Aggravating factors: with certain motions,  Relieving factors: none stated   PRECAUTIONS: None  WEIGHT BEARING RESTRICTIONS: No  FALLS:  Has patient fallen in last 6 months? No   PLOF: Independent  PATIENT GOALS: decreased pain, improved movement of shoulder.   NEXT MD VISIT:   OBJECTIVE:   DIAGNOSTIC FINDINGS:    PATIENT SURVEYS :  FOTO- 57.3   COGNITION: Overall cognitive status: Within functional limits for tasks assessed     POSTURE: Mild fwd head, rounded shoulders   UPPER EXTREMITY ROM:   Active /Passive ROM Right eval Left eval Left 06/06/23  Shoulder flexion A  145 A 120 , P 130 P: 145  Shoulder extension     Shoulder abduction  A 100,  P120   Shoulder adduction     Shoulder internal rotation A L1 Belt line   Shoulder external rotation  45   Elbow flexion     Elbow extension     Wrist flexion     Wrist extension     Wrist ulnar deviation     Wrist radial deviation     Wrist pronation     Wrist supination     (Blank rows = not tested)  UPPER EXTREMITY MMT:  MMT Right eval Left eval  Shoulder flexion  3-  Shoulder extension    Shoulder abduction  3-  Shoulder adduction    Shoulder internal rotation  4  Shoulder external rotation  4  Middle trapezius    Lower trapezius    Elbow flexion    Elbow extension    Wrist flexion    Wrist extension    Wrist ulnar deviation    Wrist radial deviation    Wrist pronation     Wrist supination    Grip strength (lbs)    (Blank rows = not tested)   JOINT MOBILITY TESTING:  Hypomobile R GHJ    TODAY'S TREATMENT:                                                                                                                                         DATE:   06/14/2023  Therapeutic Exercise: Aerobic: Supine: Shoulder flexion/AAROM/cane 2 x 10;  cane/ER x 15;  shoulder ER butterfly x 10;   S/L: shoulder ER 2 x 10;   Seated:  Pulley flexion x 2 min;  Standing: Shoulder ER ROM x 15,  with RTB x 15; wall slides 1 UE x 15 ;  Stretches: standing L stretch at rail 5 sec x 8;  Neuromuscular Re-education: Manual Therapy: GHJ mobs for L shoulder, PROM all motions.     PATIENT EDUCATION:  Education details: updated and reviewed HEP Person educated: Patient Education method: Explanation, Demonstration, Tactile cues, Verbal cues, and Handouts Education comprehension: verbalized understanding, returned demonstration, verbal cues required, tactile cues required, and needs further education   HOME EXERCISE PROGRAM:  Access Code: [email or web address]   ASSESSMENT:  CLINICAL IMPRESSION: Focus for ther ex and manual for improving ROM. Pt with good tolerance for activities today with minimal pain. He has joint stiffness that is limiting full ROM, will continue to benefit from restoring full ROM.   Eval:  Patient presents with primary complaint of increased pain in L shoulder. He has joint stiffness that is limiting ability for full ROM. He has decreased strength and decreased ability for full functional activities due to deficits. Pt to benefit from skilled PT to improve deficits and pain.    OBJECTIVE IMPAIRMENTS: decreased activity tolerance, decreased mobility, decreased ROM, decreased strength, hypomobility, impaired UE functional use, improper body mechanics, and pain.   ACTIVITY LIMITATIONS: carrying, lifting, sleeping, dressing, hygiene/grooming, and locomotion  level  PARTICIPATION LIMITATIONS: meal prep, cleaning, laundry, shopping, community activity, and yard work  PERSONAL FACTORS:  none  are also affecting patient's functional outcome.   REHAB POTENTIAL: Good  CLINICAL DECISION MAKING: Stable/uncomplicated  EVALUATION COMPLEXITY: Low  GOALS: Goals reviewed with patient? Yes  SHORT TERM GOALS: Target date: 06/12/2023   SHORT TERM GOALS:   Patient will be independent with initial HEP   Goal status: INITIAL  2.  Pt to demo improved ROM of shoulder by at least 10 deg for flexion    Goal status: INITIAL   LONG TERM GOALS:    LONG TERM GOALS: Target date: 07/24/2023  Patient will be independent with final HEP   Goal status: INITIAL  2.  Pt to report decreased pain in shoulder to 0-2/10 with reaching, lifting, carrying to improve ability for IADLs.     Goal status: INITIAL   3.  Pt to demonstrate improved strength of shoulder to be at least 4+/5, to improve ability for lift, carry and overhead activity.    Goal status: INITIAL   4.  Pt to demo AROM to be [facility] for improved ability for ADLs and IADLs.    Goal status: INITIAL     PLAN: PT FREQUENCY: 1-2x/week  PT DURATION: 8 weeks  PLANNED INTERVENTIONS: Therapeutic exercises, Therapeutic activity, Neuromuscular re-education, Patient/Family education, Self Care, Joint mobilization, Joint manipulation, Stair training, Orthotic/Fit training, DME instructions, Aquatic Therapy, Dry Needling, Electrical stimulation, Cryotherapy, Moist heat, Taping, Ultrasound, Ionotophoresis 4mg /ml Dexamethasone, Manual therapy,  Vasopneumatic device, Traction, Spinal manipulation, Spinal mobilization,Balance training, Gait training,   PLAN FOR NEXT SESSION:  IR behind back.    Sedalia Muta, PT, DPT 8:54 AM  06/14/23

## 2023-06-20 ENCOUNTER — Encounter: Payer: Self-pay | Admitting: Physical Therapy

## 2023-06-20 ENCOUNTER — Ambulatory Visit (INDEPENDENT_AMBULATORY_CARE_PROVIDER_SITE_OTHER): Payer: 59 | Admitting: Physical Therapy

## 2023-06-20 DIAGNOSIS — M25512 Pain in left shoulder: Secondary | ICD-10-CM | POA: Diagnosis not present

## 2023-06-20 DIAGNOSIS — M25612 Stiffness of left shoulder, not elsewhere classified: Secondary | ICD-10-CM

## 2023-06-20 NOTE — Therapy (Signed)
OUTPATIENT PHYSICAL THERAPY UPPER EXTREMITY TREATMENT   Patient Name: Geoffrey Evans MRN: [id number] DOB:04/18/1961, 62 y.o., male Today's Date: 06/20/2023  END OF SESSION:  PT End of Session - 06/20/23 0808     Visit Number 4    Number of Visits 16    Date for PT Re-Evaluation 07/24/23    Authorization Type [employer]    PT Start Time 0808    PT Stop Time 0846    PT Time Calculation (min) 38 min    Activity Tolerance Patient tolerated treatment well    Behavior During Therapy [facility] for tasks assessed/performed              Past Medical History:  Diagnosis Date   OSA (obstructive sleep apnea) 2010   Seasonal allergies    Sleep apnea    WEAR C PAP   Past Surgical History:  Procedure Laterality Date   COLONOSCOPY  04/2014   X 2   Patient Active Problem List   Diagnosis Date Noted   Cervical spondylosis with myelopathy 05/11/2023   Chronic left shoulder pain 05/08/2023   Left arm numbness 05/08/2023   Primary osteoarthritis, left shoulder 05/08/2023   Pure hyperglyceridemia 11/30/2022   Class 2 severe obesity due to excess calories with serious comorbidity and body mass index (BMI) of 38.0 to 38.9 in adult [facility]) 11/30/2022   Allergic dermatitis 10/08/2021   Bilateral hearing loss 04/16/2020   Erectile dysfunction due to diseases classified elsewhere 04/16/2020   Colon polyps 04/13/2017   Routine general medical examination at a health care facility 12/07/2015   OSA (obstructive sleep apnea) 12/07/2015    PCP: Sanda Linger.  REFERRING PROVIDER: Clementeen Graham  REFERRING DIAG: L shoulder pain   THERAPY DIAG:  Acute pain of left shoulder  Stiffness of joint, shoulder region, left  Rationale for Evaluation and Treatment: Rehabilitation  ONSET DATE:    SUBJECTIVE:                                                                                                                                                                                      SUBJECTIVE  STATEMENT: Pt with no new complaints.    Eval: L shoulder Pain, has Pain in upper arm, mostly with elevation, reaching out to side, and behind the back.He got injection recently that has helped some.  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 Yes: NPRS scale: 4/10 Pain location: L shoulder  Pain description: sore, throbbing.  Aggravating factors: with certain motions,  Relieving factors: none stated  PRECAUTIONS: None  WEIGHT BEARING RESTRICTIONS: No  FALLS:  Has patient fallen in last 6 months? No   PLOF: Independent  PATIENT GOALS: decreased pain, improved movement of shoulder.   NEXT MD VISIT:   OBJECTIVE:   DIAGNOSTIC FINDINGS:    PATIENT SURVEYS :  FOTO- 57.3   COGNITION: Overall cognitive status: Within functional limits for tasks assessed     POSTURE: Mild fwd head, rounded shoulders   UPPER EXTREMITY ROM:   Active /Passive ROM Right eval Left eval Left 06/06/23  Shoulder flexion A  145 A 120 , P 130 P: 145  Shoulder extension     Shoulder abduction  A 100,  P120   Shoulder adduction     Shoulder internal rotation A L1 Belt line   Shoulder external rotation  45   Elbow flexion     Elbow extension     Wrist flexion     Wrist extension     Wrist ulnar deviation     Wrist radial deviation     Wrist pronation     Wrist supination     (Blank rows = not tested)  UPPER EXTREMITY MMT:  MMT Right eval Left eval  Shoulder flexion  3-  Shoulder extension    Shoulder abduction  3-  Shoulder adduction    Shoulder internal rotation  4  Shoulder external rotation  4  Middle trapezius    Lower trapezius    Elbow flexion    Elbow extension    Wrist flexion    Wrist extension    Wrist ulnar deviation    Wrist radial deviation    Wrist pronation    Wrist supination    Grip strength (lbs)    (Blank rows = not tested)   JOINT  MOBILITY TESTING:  Hypomobile R GHJ    TODAY'S TREATMENT:                                                                                                                                         DATE:   06/20/2023  Therapeutic Exercise: Aerobic: Supine: Shoulder flexion/AAROM/cane 2 x 10;  cane/ER x 15;  shoulder ER butterfly x 10;   Seated:  Pulley flexion x 2 min;  Standing: Shoulder ER ROM x 15,  with RTB x 15;  flexion/arom x 10 Stretches: standing L stretch at rail 5 sec x 8;  IR behind back, stick, 3 way, and strap each x 10;  strap with manual mulligan mobilization;  Neuromuscular Re-education: Manual Therapy: GHJ mobs for L shoulder, PROM all motions.     PATIENT EDUCATION:  Education details: reviewed HEP Person educated: Patient Education method: Explanation, Demonstration, Tactile cues, Verbal cues, and Handouts Education comprehension: verbalized understanding, returned demonstration, verbal cues required, tactile cues required, and needs further education   HOME EXERCISE PROGRAM: Access Code: [email or web address]   ASSESSMENT:  CLINICAL IMPRESSION: Focus on continuing  to improve ROM today. Pt with much stiffness today for all motions. Minimal pain and good tolerance for manual and PROM. Updated HEP to include IR behind the back. Plan to continue focus on ROM.   Eval:  Patient presents with primary complaint of increased pain in L shoulder. He has joint stiffness that is limiting ability for full ROM. He has decreased strength and decreased ability for full functional activities due to deficits. Pt to benefit from skilled PT to improve deficits and pain.    OBJECTIVE IMPAIRMENTS: decreased activity tolerance, decreased mobility, decreased ROM, decreased strength, hypomobility, impaired UE functional use, improper body mechanics, and pain.   ACTIVITY LIMITATIONS: carrying, lifting, sleeping, dressing, hygiene/grooming, and locomotion level  PARTICIPATION LIMITATIONS: meal  prep, cleaning, laundry, shopping, community activity, and yard work  PERSONAL FACTORS:  none  are also affecting patient's functional outcome.   REHAB POTENTIAL: Good  CLINICAL DECISION MAKING: Stable/uncomplicated  EVALUATION COMPLEXITY: Low  GOALS: Goals reviewed with patient? Yes  SHORT TERM GOALS: Target date: 06/12/2023   SHORT TERM GOALS:   Patient will be independent with initial HEP   Goal status: INITIAL  2.  Pt to demo improved ROM of shoulder by at least 10 deg for flexion    Goal status: INITIAL   LONG TERM GOALS:    LONG TERM GOALS: Target date: 07/24/2023  Patient will be independent with final HEP   Goal status: INITIAL  2.  Pt to report decreased pain in shoulder to 0-2/10 with reaching, lifting, carrying to improve ability for IADLs.     Goal status: INITIAL   3.  Pt to demonstrate improved strength of shoulder to be at least 4+/5, to improve ability for lift, carry and overhead activity.    Goal status: INITIAL   4.  Pt to demo AROM to be [facility] for improved ability for ADLs and IADLs.    Goal status: INITIAL     PLAN: PT FREQUENCY: 1-2x/week  PT DURATION: 8 weeks  PLANNED INTERVENTIONS: Therapeutic exercises, Therapeutic activity, Neuromuscular re-education, Patient/Family education, Self Care, Joint mobilization, Joint manipulation, Stair training, Orthotic/Fit training, DME instructions, Aquatic Therapy, Dry Needling, Electrical stimulation, Cryotherapy, Moist heat, Taping, Ultrasound, Ionotophoresis 4mg /ml Dexamethasone, Manual therapy,  Vasopneumatic device, Traction, Spinal manipulation, Spinal mobilization,Balance training, Gait training,   PLAN FOR NEXT SESSION:  IR behind back.    Sedalia Muta, PT, DPT 8:21 AM  06/20/23

## 2023-06-23 ENCOUNTER — Ambulatory Visit (INDEPENDENT_AMBULATORY_CARE_PROVIDER_SITE_OTHER): Payer: 59 | Admitting: Physical Therapy

## 2023-06-23 ENCOUNTER — Encounter: Payer: Self-pay | Admitting: Physical Therapy

## 2023-06-23 DIAGNOSIS — M25512 Pain in left shoulder: Secondary | ICD-10-CM | POA: Diagnosis not present

## 2023-06-23 DIAGNOSIS — M25612 Stiffness of left shoulder, not elsewhere classified: Secondary | ICD-10-CM | POA: Diagnosis not present

## 2023-06-23 NOTE — Therapy (Signed)
OUTPATIENT PHYSICAL THERAPY UPPER EXTREMITY TREATMENT   Patient Name: Geoffrey Evans MRN: [id number] DOB:1961-07-28, 62 y.o., male Today's Date: 06/23/2023  END OF SESSION:  PT End of Session - 06/23/23 0930     Visit Number 5    Number of Visits 16    Date for PT Re-Evaluation 07/24/23    Authorization Type [employer]    PT Start Time 0932    PT Stop Time 1015    PT Time Calculation (min) 43 min    Activity Tolerance Patient tolerated treatment well    Behavior During Therapy WFL for tasks assessed/performed              Past Medical History:  Diagnosis Date   OSA (obstructive sleep apnea) 2010   Seasonal allergies    Sleep apnea    WEAR C PAP   Past Surgical History:  Procedure Laterality Date   COLONOSCOPY  04/2014   X 2   Patient Active Problem List   Diagnosis Date Noted   Cervical spondylosis with myelopathy 05/11/2023   Chronic left shoulder pain 05/08/2023   Left arm numbness 05/08/2023   Primary osteoarthritis, left shoulder 05/08/2023   Pure hyperglyceridemia 11/30/2022   Class 2 severe obesity due to excess calories with serious comorbidity and body mass index (BMI) of 38.0 to 38.9 in adult [facility]) 11/30/2022   Allergic dermatitis 10/08/2021   Bilateral hearing loss 04/16/2020   Erectile dysfunction due to diseases classified elsewhere 04/16/2020   Colon polyps 04/13/2017   Routine general medical examination at a health care facility 12/07/2015   OSA (obstructive sleep apnea) 12/07/2015    PCP: Sanda Linger.  REFERRING PROVIDER: Clementeen Graham  REFERRING DIAG: L shoulder pain   THERAPY DIAG:  Acute pain of left shoulder  Stiffness of joint, shoulder region, left  Rationale for Evaluation and Treatment: Rehabilitation  ONSET DATE:    SUBJECTIVE:                                                                                                                                                                                      SUBJECTIVE  STATEMENT: Pt states feeling looser after last visit.    Eval: L shoulder Pain, has Pain in upper arm, mostly with elevation, reaching out to side, and behind the back.He got injection recently that has helped some.  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 Yes: NPRS scale: 4/10 Pain location: L shoulder  Pain description: sore, throbbing.  Aggravating factors: with certain motions,  Relieving factors: none  stated   PRECAUTIONS: None  WEIGHT BEARING RESTRICTIONS: No  FALLS:  Has patient fallen in last 6 months? No   PLOF: Independent  PATIENT GOALS: decreased pain, improved movement of shoulder.   NEXT MD VISIT:   OBJECTIVE:   DIAGNOSTIC FINDINGS:    PATIENT SURVEYS :  FOTO- 57.3   COGNITION: Overall cognitive status: Within functional limits for tasks assessed     POSTURE: Mild fwd head, rounded shoulders   UPPER EXTREMITY ROM:   Active /Passive ROM Right eval Left eval Left 06/06/23  Shoulder flexion A  145 A 120 , P 130 P: 145  Shoulder extension     Shoulder abduction  A 100,  P120   Shoulder adduction     Shoulder internal rotation A L1 Belt line   Shoulder external rotation  45   Elbow flexion     Elbow extension     Wrist flexion     Wrist extension     Wrist ulnar deviation     Wrist radial deviation     Wrist pronation     Wrist supination     (Blank rows = not tested)  UPPER EXTREMITY MMT:  MMT Right eval Left eval  Shoulder flexion  3-  Shoulder extension    Shoulder abduction  3-  Shoulder adduction    Shoulder internal rotation  4  Shoulder external rotation  4  Middle trapezius    Lower trapezius    Elbow flexion    Elbow extension    Wrist flexion    Wrist extension    Wrist ulnar deviation    Wrist radial deviation    Wrist pronation    Wrist supination    Grip strength (lbs)    (Blank rows = not  tested)   JOINT MOBILITY TESTING:  Hypomobile R GHJ    TODAY'S TREATMENT:                                                                                                                                         DATE:   06/23/2023 Therapeutic Exercise: Aerobic: Supine: Shoulder flexion/AAROM/cane 2 x 10;  cane/ER x 15;  shoulder ER butterfly x 10;   Seated:  Pulley flexion and abd x 2 min  ea;  Standing:   Shoulder ER with RTB 2 x 10;  flexion and abd /arom x 15 ea;  Stretches: standing L stretch at rail 5 sec x 8;  IR behind back,2 hand hold 2 x 5; seated: posterior shoulder 15 sec x 3;  Neuromuscular Re-education: Manual Therapy: GHJ mobs for L shoulder, PROM all motions.  Other: seated HSS, LTR, piriformis- education for HEP for back tightness.     PATIENT EDUCATION:  Education details: reviewed HEP Person educated: Patient Education method: Explanation, Demonstration, Tactile cues, Verbal cues, and Handouts Education comprehension: verbalized understanding, returned demonstration, verbal cues required, tactile cues required, and  needs further education   HOME EXERCISE PROGRAM: Access Code: [email or web address]   ASSESSMENT:  CLINICAL IMPRESSION: Pt with improved AROM and improved pain today with rom. He continues to have joint stiffness for flex and ER, will benefit from continued focus on this. Updated HEP today.   Eval:  Patient presents with primary complaint of increased pain in L shoulder. He has joint stiffness that is limiting ability for full ROM. He has decreased strength and decreased ability for full functional activities due to deficits. Pt to benefit from skilled PT to improve deficits and pain.    OBJECTIVE IMPAIRMENTS: decreased activity tolerance, decreased mobility, decreased ROM, decreased strength, hypomobility, impaired UE functional use, improper body mechanics, and pain.   ACTIVITY LIMITATIONS: carrying, lifting, sleeping, dressing, hygiene/grooming, and  locomotion level  PARTICIPATION LIMITATIONS: meal prep, cleaning, laundry, shopping, community activity, and yard work  PERSONAL FACTORS:  none  are also affecting patient's functional outcome.   REHAB POTENTIAL: Good  CLINICAL DECISION MAKING: Stable/uncomplicated  EVALUATION COMPLEXITY: Low  GOALS: Goals reviewed with patient? Yes  SHORT TERM GOALS: Target date: 06/12/2023   SHORT TERM GOALS:   Patient will be independent with initial HEP   Goal status: INITIAL  2.  Pt to demo improved ROM of shoulder by at least 10 deg for flexion    Goal status: INITIAL   LONG TERM GOALS:    LONG TERM GOALS: Target date: 07/24/2023  Patient will be independent with final HEP   Goal status: INITIAL  2.  Pt to report decreased pain in shoulder to 0-2/10 with reaching, lifting, carrying to improve ability for IADLs.     Goal status: INITIAL   3.  Pt to demonstrate improved strength of shoulder to be at least 4+/5, to improve ability for lift, carry and overhead activity.    Goal status: INITIAL   4.  Pt to demo AROM to be [facility] for improved ability for ADLs and IADLs.    Goal status: INITIAL     PLAN: PT FREQUENCY: 1-2x/week  PT DURATION: 8 weeks  PLANNED INTERVENTIONS: Therapeutic exercises, Therapeutic activity, Neuromuscular re-education, Patient/Family education, Self Care, Joint mobilization, Joint manipulation, Stair training, Orthotic/Fit training, DME instructions, Aquatic Therapy, Dry Needling, Electrical stimulation, Cryotherapy, Moist heat, Taping, Ultrasound, Ionotophoresis 4mg /ml Dexamethasone, Manual therapy,  Vasopneumatic device, Traction, Spinal manipulation, Spinal mobilization,Balance training, Gait training,   PLAN FOR NEXT SESSION:  IR behind back.    Sedalia Muta, PT, DPT 9:31 AM  06/23/23

## 2023-06-26 NOTE — Progress Notes (Unsigned)
   Rubin Payor, PhD, LAT, ATC acting as a scribe for Clementeen Graham, MD.  Geoffrey Evans is a 62 y.o. male who presents to [facility] today for 6-wk f/u L shoulder pain. Pt was last seen by Dr. Denyse Amass on 05/16/23 and was given a L GH steroid injection and was referred to PT, completing 5 visits.   Today, pt reports L shoulder is feeling a lot better. He feels like PT has been beneficial and is working on [employer] on his own. Stiffness remains.   He will be flying to [place] on August 23.  This is a long flight that is fatiguing.  He is worried his pain will worsen around the time of the flight.  Dx imaging: 05/08/23 L shoulder & c-spine XR   Pertinent review of systems: No fevers or chills  Relevant historical information: Sleep apnea.   Exam:  BP 132/88   Pulse (!) 55   Ht 5\' 11"  (1.803 m)   Wt 274 lb (124.3 kg)   SpO2 97%   BMI 38.22 kg/m  General: Well Developed, well nourished, and in no acute distress.   MSK: Left shoulder: Normal-appearing Range of motion: Slightly limited abduction and functional internal rotation otherwise intact. Strength is intact.   Lab and Radiology Results   EXAM: LEFT SHOULDER - 2+ VIEW   COMPARISON:  None Available.   FINDINGS: The mineralization and alignment are normal. There is no evidence of acute fracture or dislocation. Mild acromioclavicular and glenohumeral degenerative changes. The subacromial space appears preserved.   IMPRESSION: Mild degenerative changes. No acute osseous findings.     Electronically Signed   By: Carey Bullocks M.D.   On: 05/08/2023 09:35     Assessment and Plan: 62 y.o. male with left shoulder pain.  Improving with physical therapy.  Plan to continue PT for now.  Will go ahead and make a tentative appointment for August 19.  We could do a steroid injection at that time just prior to his trip to [place] if needed.  PDMP not reviewed this encounter. No orders of  the defined types were placed in this encounter.  No orders of the defined types were placed in this encounter.    Discussed warning signs or symptoms. Please see discharge instructions. Patient expresses understanding.   The above documentation has been reviewed and is accurate and complete Clementeen Graham, M.D.

## 2023-06-27 ENCOUNTER — Ambulatory Visit (INDEPENDENT_AMBULATORY_CARE_PROVIDER_SITE_OTHER): Payer: 59 | Admitting: Family Medicine

## 2023-06-27 VITALS — BP 132/88 | HR 55 | Ht 71.0 in | Wt 274.0 lb

## 2023-06-27 DIAGNOSIS — G8929 Other chronic pain: Secondary | ICD-10-CM | POA: Diagnosis not present

## 2023-06-27 DIAGNOSIS — M25512 Pain in left shoulder: Secondary | ICD-10-CM

## 2023-06-27 NOTE — Patient Instructions (Signed)
Thank you for coming in today.   We can do an injection before your flight to [place].  Schedule around Aug 19th for presumed injection.   Continue PT for now.

## 2023-07-03 ENCOUNTER — Encounter: Payer: Self-pay | Admitting: Physical Therapy

## 2023-07-03 ENCOUNTER — Ambulatory Visit: Payer: 59 | Admitting: Physical Therapy

## 2023-07-03 DIAGNOSIS — M25512 Pain in left shoulder: Secondary | ICD-10-CM | POA: Diagnosis not present

## 2023-07-03 DIAGNOSIS — M25612 Stiffness of left shoulder, not elsewhere classified: Secondary | ICD-10-CM | POA: Diagnosis not present

## 2023-07-03 NOTE — Therapy (Signed)
OUTPATIENT PHYSICAL THERAPY UPPER EXTREMITY TREATMENT   Patient Name: Geoffrey Evans MRN: [id number] DOB:10-May-1961, 62 y.o., male Today's Date: 07/03/2023  END OF SESSION:  PT End of Session - 07/03/23 1450     Visit Number 6    Number of Visits 16    Date for PT Re-Evaluation 07/24/23    Authorization Type [employer]    PT Start Time 1435    PT Stop Time 1515    PT Time Calculation (min) 40 min    Activity Tolerance Patient tolerated treatment well    Behavior During Therapy WFL for tasks assessed/performed               Past Medical History:  Diagnosis Date   OSA (obstructive sleep apnea) 2010   Seasonal allergies    Sleep apnea    WEAR C PAP   Past Surgical History:  Procedure Laterality Date   COLONOSCOPY  04/2014   X 2   Patient Active Problem List   Diagnosis Date Noted   Cervical spondylosis with myelopathy 05/11/2023   Chronic left shoulder pain 05/08/2023   Left arm numbness 05/08/2023   Primary osteoarthritis, left shoulder 05/08/2023   Pure hyperglyceridemia 11/30/2022   Class 2 severe obesity due to excess calories with serious comorbidity and body mass index (BMI) of 38.0 to 38.9 in adult [facility]) 11/30/2022   Allergic dermatitis 10/08/2021   Bilateral hearing loss 04/16/2020   Erectile dysfunction due to diseases classified elsewhere 04/16/2020   Colon polyps 04/13/2017   Routine general medical examination at a health care facility 12/07/2015   OSA (obstructive sleep apnea) 12/07/2015    PCP: Sanda Linger.  REFERRING PROVIDER: Clementeen Graham  REFERRING DIAG: L shoulder pain   THERAPY DIAG:  Acute pain of left shoulder  Stiffness of joint, shoulder region, left  Rationale for Evaluation and Treatment: Rehabilitation  ONSET DATE:    SUBJECTIVE:                                                                                                                                                                                      SUBJECTIVE  STATEMENT: Pt states doing well and had a good week last week, has been doing HEP. He does have more stiffness today.    Eval: L shoulder Pain, has Pain in upper arm, mostly with elevation, reaching out to side, and behind the back.He got injection recently that has helped some.  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 Yes: NPRS scale: 4/10 Pain location: L shoulder  Pain description: sore, throbbing.  Aggravating factors: with certain motions,  Relieving factors: none stated   PRECAUTIONS: None  WEIGHT BEARING RESTRICTIONS: No  FALLS:  Has patient fallen in last 6 months? No   PLOF: Independent  PATIENT GOALS: decreased pain, improved movement of shoulder.   NEXT MD VISIT:   OBJECTIVE:   DIAGNOSTIC FINDINGS:    PATIENT SURVEYS :  FOTO- 57.3   COGNITION: Overall cognitive status: Within functional limits for tasks assessed     POSTURE: Mild fwd head, rounded shoulders   UPPER EXTREMITY ROM:   Active /Passive ROM Right eval Left eval Left 06/06/23 Left 07/03/23  Shoulder flexion A  145 A 120 , P 130 P: 145 P: 152  Shoulder extension      Shoulder abduction  A 100,  P120    Shoulder adduction      Shoulder internal rotation A L1 Belt line    Shoulder external rotation  45    Elbow flexion      Elbow extension      Wrist flexion      Wrist extension      Wrist ulnar deviation      Wrist radial deviation      Wrist pronation      Wrist supination      (Blank rows = not tested)  UPPER EXTREMITY MMT:  MMT Right eval Left eval  Shoulder flexion  3-  Shoulder extension    Shoulder abduction  3-  Shoulder adduction    Shoulder internal rotation  4  Shoulder external rotation  4  Middle trapezius    Lower trapezius    Elbow flexion    Elbow extension    Wrist flexion    Wrist extension    Wrist ulnar deviation    Wrist radial deviation     Wrist pronation    Wrist supination    Grip strength (lbs)    (Blank rows = not tested)   JOINT MOBILITY TESTING:  Hypomobile R GHJ    TODAY'S TREATMENT:                                                                                                                                         DATE:   07/03/2023 Therapeutic Exercise: Aerobic: Supine: Shoulder flexion/AAROM/cane 2 x 10;   shoulder ER butterfly x 10;  Er at 90 deg with cane x 15;   IR/ER with contract relax,  IR/ER AROM x 15;   flexion/arom x 15;  Seated:  Pulley flexion and abd x 2 min  ea;  Standing:   Shoulder ER ROM x 15;     flexion arom x 15 ea;  Stretches:  IR behind back,with stick 2 x 10;   posterior shoulder stretch; 15 sec x 3;  Neuromuscular Re-education: Manual Therapy: GHJ mobs for L shoulder, PROM all motions.     PATIENT EDUCATION:  Education details: reviewed HEP Person educated: Patient Education method: Explanation, Demonstration, Tactile cues, Verbal cues, and Handouts Education comprehension: verbalized understanding, returned demonstration, verbal cues required, tactile cues required, and needs further education   HOME EXERCISE PROGRAM: Access Code: [email or web address]   ASSESSMENT:  CLINICAL IMPRESSION: Pt continues to improve with arom and prom. He still has stiffness and limitations at end ranges. IR still quite limited. Pain improving with ROM, but still feels sore at end range for flexion today. Will benefit from continued care with focus on ROM.   Eval:  Patient presents with primary complaint of increased pain in L shoulder. He has joint stiffness that is limiting ability for full ROM. He has decreased strength and decreased ability for full functional activities due to deficits. Pt to benefit from skilled PT to improve deficits and pain.    OBJECTIVE IMPAIRMENTS: decreased activity tolerance, decreased mobility, decreased ROM, decreased strength, hypomobility, impaired UE functional use,  improper body mechanics, and pain.   ACTIVITY LIMITATIONS: carrying, lifting, sleeping, dressing, hygiene/grooming, and locomotion level  PARTICIPATION LIMITATIONS: meal prep, cleaning, laundry, shopping, community activity, and yard work  PERSONAL FACTORS:  none  are also affecting patient's functional outcome.   REHAB POTENTIAL: Good  CLINICAL DECISION MAKING: Stable/uncomplicated  EVALUATION COMPLEXITY: Low  GOALS: Goals reviewed with patient? Yes  SHORT TERM GOALS: Target date: 06/12/2023   SHORT TERM GOALS:   Patient will be independent with initial HEP   Goal status: INITIAL  2.  Pt to demo improved ROM of shoulder by at least 10 deg for flexion    Goal status: INITIAL   LONG TERM GOALS:    LONG TERM GOALS: Target date: 07/24/2023  Patient will be independent with final HEP   Goal status: INITIAL  2.  Pt to report decreased pain in shoulder to 0-2/10 with reaching, lifting, carrying to improve ability for IADLs.     Goal status: INITIAL   3.  Pt to demonstrate improved strength of shoulder to be at least 4+/5, to improve ability for lift, carry and overhead activity.    Goal status: INITIAL   4.  Pt to demo AROM to be [facility] for improved ability for ADLs and IADLs.    Goal status: INITIAL     PLAN: PT FREQUENCY: 1-2x/week  PT DURATION: 8 weeks  PLANNED INTERVENTIONS: Therapeutic exercises, Therapeutic activity, Neuromuscular re-education, Patient/Family education, Self Care, Joint mobilization, Joint manipulation, Stair training, Orthotic/Fit training, DME instructions, Aquatic Therapy, Dry Needling, Electrical stimulation, Cryotherapy, Moist heat, Taping, Ultrasound, Ionotophoresis 4mg /ml Dexamethasone, Manual therapy,  Vasopneumatic device, Traction, Spinal manipulation, Spinal mobilization,Balance training, Gait training,   PLAN FOR NEXT SESSION:  IR behind back.    Sedalia Muta, PT, DPT 2:51 PM  07/03/23

## 2023-07-10 ENCOUNTER — Encounter: Payer: Self-pay | Admitting: Physical Therapy

## 2023-07-10 ENCOUNTER — Ambulatory Visit (INDEPENDENT_AMBULATORY_CARE_PROVIDER_SITE_OTHER): Payer: 59 | Admitting: Physical Therapy

## 2023-07-10 DIAGNOSIS — M25612 Stiffness of left shoulder, not elsewhere classified: Secondary | ICD-10-CM | POA: Diagnosis not present

## 2023-07-10 DIAGNOSIS — M25512 Pain in left shoulder: Secondary | ICD-10-CM | POA: Diagnosis not present

## 2023-07-10 NOTE — Therapy (Signed)
OUTPATIENT PHYSICAL THERAPY UPPER EXTREMITY TREATMENT   Patient Name: Geoffrey Evans MRN: [id number] DOB:February 21, 1961, 62 y.o., male Today's Date: 07/10/2023  END OF SESSION:  PT End of Session - 07/10/23 1216     Visit Number 7    Number of Visits 16    Date for PT Re-Evaluation 07/24/23    Authorization Type [employer]    PT Start Time 1217    PT Stop Time 1258    PT Time Calculation (min) 41 min    Activity Tolerance Patient tolerated treatment well    Behavior During Therapy [facility] for tasks assessed/performed               Past Medical History:  Diagnosis Date   OSA (obstructive sleep apnea) 2010   Seasonal allergies    Sleep apnea    WEAR C PAP   Past Surgical History:  Procedure Laterality Date   COLONOSCOPY  04/2014   X 2   Patient Active Problem List   Diagnosis Date Noted   Cervical spondylosis with myelopathy 05/11/2023   Chronic left shoulder pain 05/08/2023   Left arm numbness 05/08/2023   Primary osteoarthritis, left shoulder 05/08/2023   Pure hyperglyceridemia 11/30/2022   Class 2 severe obesity due to excess calories with serious comorbidity and body mass index (BMI) of 38.0 to 38.9 in adult [facility]) 11/30/2022   Allergic dermatitis 10/08/2021   Bilateral hearing loss 04/16/2020   Erectile dysfunction due to diseases classified elsewhere 04/16/2020   Colon polyps 04/13/2017   Routine general medical examination at a health care facility 12/07/2015   OSA (obstructive sleep apnea) 12/07/2015    PCP: Sanda Linger.  REFERRING PROVIDER: Clementeen Graham  REFERRING DIAG: L shoulder pain   THERAPY DIAG:  Acute pain of left shoulder  Stiffness of joint, shoulder region, left  Rationale for Evaluation and Treatment: Rehabilitation  ONSET DATE:    SUBJECTIVE:                                                                                                                                                                                      SUBJECTIVE  STATEMENT: Pt states doing well and had a good week last week, with minimal pain, just stiffness.    Eval: L shoulder Pain, has Pain in upper arm, mostly with elevation, reaching out to side, and behind the back.He got injection recently that has helped some.  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 Yes: NPRS scale: 0-2/10 Pain location: L shoulder  Pain description: sore, throbbing.  Aggravating factors: with certain motions,  Relieving factors: none stated   PRECAUTIONS: None  WEIGHT BEARING RESTRICTIONS: No  FALLS:  Has patient fallen in last 6 months? No   PLOF: Independent  PATIENT GOALS: decreased pain, improved movement of shoulder.   NEXT MD VISIT:   OBJECTIVE:   DIAGNOSTIC FINDINGS:    PATIENT SURVEYS :  FOTO- 57.3   COGNITION: Overall cognitive status: Within functional limits for tasks assessed     POSTURE: Mild fwd head, rounded shoulders   UPPER EXTREMITY ROM:   Active /Passive ROM Right eval Left eval Left 06/06/23 Left 07/03/23  Shoulder flexion A  145 A 120 , P 130 P: 145 P: 152  Shoulder extension      Shoulder abduction  A 100,  P120    Shoulder adduction      Shoulder internal rotation A L1 Belt line    Shoulder external rotation  45    Elbow flexion      Elbow extension      Wrist flexion      Wrist extension      Wrist ulnar deviation      Wrist radial deviation      Wrist pronation      Wrist supination      (Blank rows = not tested)  UPPER EXTREMITY MMT:  MMT Right eval Left eval  Shoulder flexion  3-  Shoulder extension    Shoulder abduction  3-  Shoulder adduction    Shoulder internal rotation  4  Shoulder external rotation  4  Middle trapezius    Lower trapezius    Elbow flexion    Elbow extension    Wrist flexion    Wrist extension    Wrist ulnar deviation    Wrist radial deviation    Wrist  pronation    Wrist supination    Grip strength (lbs)    (Blank rows = not tested)   JOINT MOBILITY TESTING:  Hypomobile R GHJ    TODAY'S TREATMENT:                                                                                                                                         DATE:   07/10/2023 Therapeutic Exercise: Aerobic: Supine: Shoulder flexion/AAROM/cane/2lb  2 x 10;   Seated:  Pulley flexion and abd x 2 min  ea;  Standing:     flexion arom x 10ea;  Stretches:  IR behind back,with stick(3way)  ; with strap 3 x 5; and with mulligan mobilization ; supine pec stretch 20 sec x 3 ;  Neuromuscular Re-education: Manual Therapy: GHJ mobs for L shoulder, PROM all motions. ER at 90/90.      PATIENT EDUCATION:  Education details: reviewed HEP Person educated: Patient Education method: Explanation, Demonstration, Tactile cues, Verbal cues, and Handouts Education comprehension: verbalized understanding, returned demonstration, verbal cues required, tactile cues required, and  needs further education   HOME EXERCISE PROGRAM: Access Code: [email or web address]   ASSESSMENT:  CLINICAL IMPRESSION: Pt continues to improve with arom and prom. He still has stiffness and limitations at end ranges. IR slightly improved after mobilzation today. PROM for flexion is close to R side. AROM still a bit more limited. Has had good pain improvements last week/this week. Will continue to benefit from focus on ROM.   Eval:  Patient presents with primary complaint of increased pain in L shoulder. He has joint stiffness that is limiting ability for full ROM. He has decreased strength and decreased ability for full functional activities due to deficits. Pt to benefit from skilled PT to improve deficits and pain.    OBJECTIVE IMPAIRMENTS: decreased activity tolerance, decreased mobility, decreased ROM, decreased strength, hypomobility, impaired UE functional use, improper body mechanics, and pain.   ACTIVITY  LIMITATIONS: carrying, lifting, sleeping, dressing, hygiene/grooming, and locomotion level  PARTICIPATION LIMITATIONS: meal prep, cleaning, laundry, shopping, community activity, and yard work  PERSONAL FACTORS:  none  are also affecting patient's functional outcome.   REHAB POTENTIAL: Good  CLINICAL DECISION MAKING: Stable/uncomplicated  EVALUATION COMPLEXITY: Low  GOALS: Goals reviewed with patient? Yes  SHORT TERM GOALS: Target date: 06/12/2023   SHORT TERM GOALS:   Patient will be independent with initial HEP   Goal status: INITIAL  2.  Pt to demo improved ROM of shoulder by at least 10 deg for flexion    Goal status: INITIAL   LONG TERM GOALS:    LONG TERM GOALS: Target date: 07/24/2023  Patient will be independent with final HEP   Goal status: INITIAL  2.  Pt to report decreased pain in shoulder to 0-2/10 with reaching, lifting, carrying to improve ability for IADLs.     Goal status: INITIAL   3.  Pt to demonstrate improved strength of shoulder to be at least 4+/5, to improve ability for lift, carry and overhead activity.    Goal status: INITIAL   4.  Pt to demo AROM to be [facility] for improved ability for ADLs and IADLs.    Goal status: INITIAL     PLAN: PT FREQUENCY: 1-2x/week  PT DURATION: 8 weeks  PLANNED INTERVENTIONS: Therapeutic exercises, Therapeutic activity, Neuromuscular re-education, Patient/Family education, Self Care, Joint mobilization, Joint manipulation, Stair training, Orthotic/Fit training, DME instructions, Aquatic Therapy, Dry Needling, Electrical stimulation, Cryotherapy, Moist heat, Taping, Ultrasound, Ionotophoresis 4mg /ml Dexamethasone, Manual therapy,  Vasopneumatic device, Traction, Spinal manipulation, Spinal mobilization,Balance training, Gait training,   PLAN FOR NEXT SESSION:  IR behind back.    Sedalia Muta, PT, DPT 12:17 PM  07/10/23

## 2023-07-14 ENCOUNTER — Ambulatory Visit (INDEPENDENT_AMBULATORY_CARE_PROVIDER_SITE_OTHER): Payer: 59 | Admitting: Physical Therapy

## 2023-07-14 ENCOUNTER — Encounter: Payer: Self-pay | Admitting: Physical Therapy

## 2023-07-14 DIAGNOSIS — M25612 Stiffness of left shoulder, not elsewhere classified: Secondary | ICD-10-CM

## 2023-07-14 DIAGNOSIS — M25512 Pain in left shoulder: Secondary | ICD-10-CM | POA: Diagnosis not present

## 2023-07-14 NOTE — Therapy (Addendum)
OUTPATIENT PHYSICAL THERAPY UPPER EXTREMITY TREATMENT   Patient Name: Geoffrey Evans MRN: [id number] DOB:18-Mar-1961, 62 y.o., male Today's Date: 07/14/2023  END OF SESSION:  PT End of Session - 07/14/23 0957     Visit Number 8    Number of Visits 16    Date for PT Re-Evaluation 07/24/23    Authorization Type [employer]    PT Start Time 0935    PT Stop Time 1015    PT Time Calculation (min) 40 min    Activity Tolerance Patient tolerated treatment well    Behavior During Therapy [facility] for tasks assessed/performed                Past Medical History:  Diagnosis Date   OSA (obstructive sleep apnea) 2010   Seasonal allergies    Sleep apnea    WEAR C PAP   Past Surgical History:  Procedure Laterality Date   COLONOSCOPY  04/2014   X 2   Patient Active Problem List   Diagnosis Date Noted   Cervical spondylosis with myelopathy 05/11/2023   Chronic left shoulder pain 05/08/2023   Left arm numbness 05/08/2023   Primary osteoarthritis, left shoulder 05/08/2023   Pure hyperglyceridemia 11/30/2022   Class 2 severe obesity due to excess calories with serious comorbidity and body mass index (BMI) of 38.0 to 38.9 in adult [facility]) 11/30/2022   Allergic dermatitis 10/08/2021   Bilateral hearing loss 04/16/2020   Erectile dysfunction due to diseases classified elsewhere 04/16/2020   Colon polyps 04/13/2017   Routine general medical examination at a health care facility 12/07/2015   OSA (obstructive sleep apnea) 12/07/2015    PCP: Sanda Linger.  REFERRING PROVIDER: Clementeen Graham  REFERRING DIAG: L shoulder pain   THERAPY DIAG:  Acute pain of left shoulder  Stiffness of joint, shoulder region, left  Rationale for Evaluation and Treatment: Rehabilitation  ONSET DATE:    SUBJECTIVE:                                                                                                                                                                                      SUBJECTIVE  STATEMENT: Pt states doing well , feels his shoulder is much less painful, no pain. Feels that it is still stiff. He is using his arm well at chest and waist height activities. He is going away for at least 3 weeks.    Eval: L shoulder Pain, has Pain in upper arm, mostly with elevation, reaching out to side, and behind the back.He got injection recently that has helped some.  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 Yes: NPRS scale: 0-2/10 Pain location: L shoulder  Pain description: sore, throbbing.  Aggravating factors: with certain motions,  Relieving factors: none stated   PRECAUTIONS: None  WEIGHT BEARING RESTRICTIONS: No  FALLS:  Has patient fallen in last 6 months? No   PLOF: Independent  PATIENT GOALS: decreased pain, improved movement of shoulder.   NEXT MD VISIT:   OBJECTIVE:   DIAGNOSTIC FINDINGS:    PATIENT SURVEYS :  FOTO- 57.3   COGNITION: Overall cognitive status: Within functional limits for tasks assessed     POSTURE: Mild fwd head, rounded shoulders   UPPER EXTREMITY ROM:   Active /Passive ROM Right eval Left eval Left 06/06/23 Left 07/03/23 L 07/14/23  Shoulder flexion A  145 A 120 , P 130 P: 145 P: 152 A:120  Shoulder extension       Shoulder abduction  A 100,  P120     Shoulder adduction       Shoulder internal rotation A L1 Belt line     Shoulder external rotation  45   60  Elbow flexion       Elbow extension       Wrist flexion       Wrist extension       Wrist ulnar deviation       Wrist radial deviation       Wrist pronation       Wrist supination       (Blank rows = not tested)  UPPER EXTREMITY MMT:  MMT Right eval Left eval  Shoulder flexion  3-  Shoulder extension    Shoulder abduction  3-  Shoulder adduction    Shoulder internal rotation  4  Shoulder external rotation  4  Middle trapezius    Lower  trapezius    Elbow flexion    Elbow extension    Wrist flexion    Wrist extension    Wrist ulnar deviation    Wrist radial deviation    Wrist pronation    Wrist supination    Grip strength (lbs)    (Blank rows = not tested)   JOINT MOBILITY TESTING:  Hypomobile R GHJ    TODAY'S TREATMENT:                                                                                                                                         DATE:   07/14/2023 Therapeutic Exercise: Aerobic: Supine: Shoulder flexion/AAROM/cane/2lb  2 x 10;  ER/cane for review of HEP, at 45 and 90 deg, x 15 ea ;  Seated:  Pulley flexion and abd x 2 min  ea;  Standing:     flexion and abd arom x 10ea;  Stretches:  IR behind back,with stick(3way)  ; with strap 3 x 5;  Neuromuscular Re-education: Manual Therapy: GHJ mobs for L  shoulder, PROM all motions.     PATIENT EDUCATION:  Education details: reviewed HEP Person educated: Patient Education method: Explanation, Demonstration, Tactile cues, Verbal cues, and Handouts Education comprehension: verbalized understanding, returned demonstration, verbal cues required, tactile cues required, and needs further education   HOME EXERCISE PROGRAM: Access Code: [email or web address]   ASSESSMENT:  CLINICAL IMPRESSION: Pt continues to improve with arom and prom. He still has stiffness and limitations at end ranges for flex, Er and IR behind the back. He is going away for at least 3 weeks. Reviewed HEP in detail, as well as ways to do mobility while traveling. He is making good progress with pain and ROM, but will benefit from continued care when he returns.    Eval:  Patient presents with primary complaint of increased pain in L shoulder. He has joint stiffness that is limiting ability for full ROM. He has decreased strength and decreased ability for full functional activities due to deficits. Pt to benefit from skilled PT to improve deficits and pain.    OBJECTIVE IMPAIRMENTS:  decreased activity tolerance, decreased mobility, decreased ROM, decreased strength, hypomobility, impaired UE functional use, improper body mechanics, and pain.   ACTIVITY LIMITATIONS: carrying, lifting, sleeping, dressing, hygiene/grooming, and locomotion level  PARTICIPATION LIMITATIONS: meal prep, cleaning, laundry, shopping, community activity, and yard work  PERSONAL FACTORS:  none  are also affecting patient's functional outcome.   REHAB POTENTIAL: Good  CLINICAL DECISION MAKING: Stable/uncomplicated  EVALUATION COMPLEXITY: Low  GOALS: Goals reviewed with patient? Yes  SHORT TERM GOALS: Target date: 06/12/2023   SHORT TERM GOALS:   Patient will be independent with initial HEP   Goal status: MET  2.  Pt to demo improved ROM of shoulder by at least 10 deg for flexion    Goal status: MET   LONG TERM GOALS:    LONG TERM GOALS: Target date: 07/24/2023  Patient will be independent with final HEP   Goal status: INITIAL  2.  Pt to report decreased pain in shoulder to 0-2/10 with reaching, lifting, carrying to improve ability for IADLs.     Goal status: MET   3.  Pt to demonstrate improved strength of shoulder to be at least 4+/5, to improve ability for lift, carry and overhead activity.    Goal status:in progress   4.  Pt to demo AROM to be [facility] for improved ability for ADLs and IADLs.    Goal status:in progress     PLAN: PT FREQUENCY: 1-2x/week  PT DURATION: 8 weeks  PLANNED INTERVENTIONS: Therapeutic exercises, Therapeutic activity, Neuromuscular re-education, Patient/Family education, Self Care, Joint mobilization, Joint manipulation, Stair training, Orthotic/Fit training, DME instructions, Aquatic Therapy, Dry Needling, Electrical stimulation, Cryotherapy, Moist heat, Taping, Ultrasound, Ionotophoresis 4mg /ml Dexamethasone, Manual therapy,  Vasopneumatic device, Traction, Spinal manipulation, Spinal mobilization,Balance training, Gait  training,   PLAN FOR NEXT SESSION:  IR behind back.    Sedalia Muta, PT, DPT 9:58 AM  07/14/23  PHYSICAL THERAPY DISCHARGE SUMMARY  Visits from Start of Care: 8   Plan: Patient agrees to discharge.  Patient goals were partially  met. Patient is being discharged due to meeting the stated rehab goals.  Pt doing very well at last visit. Went away for a few weeks and did not return to PT after that.    Sedalia Muta, PT, DPT 9:11 AM  09/13/23

## 2023-08-07 ENCOUNTER — Encounter: Payer: 59 | Admitting: Physical Therapy

## 2023-08-14 ENCOUNTER — Encounter: Payer: Self-pay | Admitting: Family Medicine

## 2023-08-14 ENCOUNTER — Ambulatory Visit: Payer: 59 | Admitting: Family Medicine

## 2023-08-14 ENCOUNTER — Other Ambulatory Visit: Payer: Self-pay

## 2023-08-14 VITALS — BP 138/84 | HR 61 | Ht 71.0 in | Wt 274.0 lb

## 2023-08-14 DIAGNOSIS — M25512 Pain in left shoulder: Secondary | ICD-10-CM

## 2023-08-14 DIAGNOSIS — G8929 Other chronic pain: Secondary | ICD-10-CM | POA: Diagnosis not present

## 2023-08-14 NOTE — Progress Notes (Signed)
   I, Stevenson Clinch, CMA acting as a scribe for Clementeen Graham, MD.  Geoffrey Evans is a 62 y.o. male who presents to [facility] today for f/u L shoulder pain. Pt was last seen by Dr. Denyse Amass on 06/27/23 and was advised to cont PT, completing total 8 visits (last visit on July 19th). He will be flying to [place] on August 23.   Today, pt reports some improvement of sx, sx have not completely resolved. Has concerns about upcoming travel. Would like repeat injection. ROM has improved with injection and PT. Has some tightness and discomfort when doing PT exercises. More severe pain has resolved. Radiating pain has resolved. Compliant with HEP.   Dx imaging: 05/08/23 L shoulder & c-spine XR   Pertinent review of systems: No fevers or chills  Relevant historical information: Obesity.  Hyperglycemia.   Exam:  BP 138/84   Pulse 61   Ht 5\' 11"  (1.803 m)   Wt 274 lb (124.3 kg)   SpO2 96%   BMI 38.22 kg/m  General: Well Developed, well nourished, and in no acute distress.   MSK: Left shoulder normal-appearing normal motion.  Intact strength.    Lab and Radiology Results  Procedure: Real-time Ultrasound Guided Injection of left shoulder glenohumeral joint posterior approach Device: Philips Affiniti 50G/GE Logiq Images permanently stored and available for review in PACS Verbal informed consent obtained.  Discussed risks and benefits of procedure. Warned about infection, bleeding, hyperglycemia damage to structures among others. Patient expresses understanding and agreement Time-out conducted.   Noted no overlying erythema, induration, or other signs of local infection.   Skin prepped in a sterile fashion.   Local anesthesia: Topical Ethyl chloride.   With sterile technique and under real time ultrasound guidance: 40 mg of Kenalog and 2 mL of Marcaine injected into glenohumeral joint. Fluid seen entering the joint capsule.   Completed without difficulty    Pain immediately resolved suggesting accurate placement of the medication.   Advised to call if fevers/chills, erythema, induration, drainage, or persistent bleeding.   Images permanently stored and available for review in the ultrasound unit.  Impression: Technically successful ultrasound guided injection.         Assessment and Plan: 62 y.o. male with left shoulder pain.  Improved with physical therapy and previous glenohumeral injection in May of this year.  Plan for repeat injection today as he has some residual pain and will be traveling to [place] this week.  Check back as needed.   PDMP not reviewed this encounter. Orders Placed This Encounter  Procedures   [place] LIMITED JOINT SPACE STRUCTURES UP LEFT(NO LINKED CHARGES)    Order Specific Question:   Reason for Exam (SYMPTOM  OR DIAGNOSIS REQUIRED)    Answer:   left shoulder pain    Order Specific Question:   Preferred imaging location?    Answer:   [facility]   No orders of the defined types were placed in this encounter.    Discussed warning signs or symptoms. Please see discharge instructions. Patient expresses understanding.   The above documentation has been reviewed and is accurate and complete Clementeen Graham, M.D.

## 2023-08-14 NOTE — Patient Instructions (Addendum)
Thank you for coming in today.   You received an injection today. Seek immediate medical attention if the joint becomes red, extremely painful, or is oozing fluid.   Keep working on your home exercises  Let me know if not getting better.  Safe travels.

## 2023-11-03 ENCOUNTER — Ambulatory Visit: Payer: 59 | Admitting: Internal Medicine

## 2023-11-03 ENCOUNTER — Encounter: Payer: Self-pay | Admitting: Internal Medicine

## 2023-11-03 VITALS — BP 138/80 | HR 67 | Temp 98.2°F | Ht 71.0 in | Wt 278.0 lb

## 2023-11-03 DIAGNOSIS — L509 Urticaria, unspecified: Secondary | ICD-10-CM | POA: Insufficient documentation

## 2023-11-03 MED ORDER — EPINEPHRINE 0.3 MG/0.3ML IJ SOAJ: 0.3000 mg | INTRAMUSCULAR | 2 refills | Status: AC | PRN

## 2023-11-03 MED ORDER — METHYLPREDNISOLONE ACETATE 80 MG/ML IJ SUSP
80.0000 mg | Freq: Once | INTRAMUSCULAR | Status: AC
  Administered 2023-11-03: 80 mg via INTRAMUSCULAR

## 2023-11-03 MED ORDER — PREDNISONE 10 MG PO TABS: ORAL_TABLET | ORAL | 0 refills | Status: DC

## 2023-11-03 NOTE — Assessment & Plan Note (Signed)
Recent onset , pt educated, advised to keep otc benadryl 50 mg at home and car for any future episodes and epipen prn, but for now for depomedrol 80 mg IM, pred taper, [place] allergy panel and food allergy panels, and consider allergy referral for any recurrence.

## 2023-11-03 NOTE — Patient Instructions (Addendum)
Please to keep OTC benadryl bottles at home and the car so you can take quickly if needed in future  You had the steroid shot today  Please take all new medication as prescribed - the prednisone  Please take all new medication as prescribed - the epipen for emergencies in future if needed  Please continue all other medications as before, and refills have been done if requested.  Please have the pharmacy call with any other refills you may need.  Please keep your appointments with your specialists as you may have planned  Please go to the LAB at the blood drawing area for the tests to be done - the allergy blood testing  You will be contacted by phone if any changes need to be made immediately.  Otherwise, you will receive a letter about your results with an explanation, but please check with MyChart first.  Please call for Allergy referral if your symptoms return at some point after the current rash is gone

## 2023-11-03 NOTE — Progress Notes (Signed)
Patient ID: Geoffrey Evans, male   DOB: January 11, 1961, 62 y.o.   MRN: [id number]        Chief Complaint: follow up hives x 3-4 days       HPI:  Geoffrey Evans is a 62 y.o. male here with above, travels for work, and no current chronic med use, now with rash with angioedematous areas to extremities that come and go, also facies likely, quite pruritic, better it seems with topical benadryl but wife had him come in today   no tongue swelling or sob   Recalls a much less dramatic episode allergy type rash several years ago.  Pt denies chest pain, increased sob or doe, wheezing, orthopnea, PND, increased LE swelling, palpitations, dizziness or syncope.   Pt denies polydipsia, polyuria, or new focal neuro s/s.          Wt Readings from Last 3 Encounters:  11/03/23 278 lb (126.1 kg)  08/14/23 274 lb (124.3 kg)  06/27/23 274 lb (124.3 kg)   BP Readings from Last 3 Encounters:  11/03/23 138/80  08/14/23 138/84  06/27/23 132/88         Past Medical History:  Diagnosis Date   OSA (obstructive sleep apnea) 2010   Seasonal allergies    Sleep apnea    WEAR C PAP   Past Surgical History:  Procedure Laterality Date   COLONOSCOPY  04/2014   X 2    reports that he has never smoked. He quit smokeless tobacco use about 33 years ago.  His smokeless tobacco use included chew. He reports that he does not drink alcohol and does not use drugs. family history includes Colon polyps in his brother; Diabetes in his brother; Heart failure in his father. No Known Allergies Current Outpatient Medications on File Prior to Visit  Medication Sig Dispense Refill   Ascorbic Acid (VITAMIN C PO) Take by mouth as needed. "WHEN FEEL A COLD COMING ON"     Multiple Vitamins-Minerals (MULTIVITAMIN WITH MINERALS) tablet Take 1 tablet by mouth daily.     vardenafil (LEVITRA) 10 MG tablet Take 1 tablet (10 mg total) by mouth daily as needed for erectile dysfunction. 10 tablet 5   No current facility-administered  medications on file prior to visit.        ROS:  All others reviewed and negative.  Objective        PE:  BP 138/80 (BP Location: Left Arm, Patient Position: Sitting, Cuff Size: Normal)   Pulse 67   Temp 98.2 F (36.8 C) (Oral)   Ht 5\' 11"  (1.803 m)   Wt 278 lb (126.1 kg)   SpO2 96%   BMI 38.77 kg/m                 Constitutional: Pt appears in NAD               HENT: Head: NCAT.                Right Ear: External ear normal.                 Left Ear: External ear normal.                Eyes: . Pupils are equal, round, and reactive to light. Conjunctivae and EOM are normal               Nose: without d/c or deformity  Neck: Neck supple. Gross normal ROM               Cardiovascular: Normal rate and regular rhythm.                 Pulmonary/Chest: Effort normal and breath sounds without rales or wheezing.                Abd:  Soft, NT, ND, + BS, no organomegaly               Neurological: Pt is alert. At baseline orientation, motor grossly intact               Skin: Skin is warm. LE edema - none, diffuse hive rash to arms, less to legs and facies               Psychiatric: Pt behavior is normal without agitation   Micro: none  Cardiac tracings I have personally interpreted today:  none  Pertinent Radiological findings (summarize): none   Lab Results  Component Value Date   WBC 6.4 11/30/2022   HGB 15.5 11/30/2022   HCT 46.2 11/30/2022   PLT 223.0 11/30/2022   GLUCOSE 85 11/30/2022   CHOL 182 11/30/2022   TRIG 188.0 (H) 11/30/2022   HDL 37.60 (L) 11/30/2022   LDLDIRECT 116.0 09/23/2021   LDLCALC 106 (H) 11/30/2022   ALT 18 11/30/2022   AST 21 11/30/2022   NA 138 11/30/2022   K 4.5 11/30/2022   CL 102 11/30/2022   CREATININE 1.07 11/30/2022   BUN 17 11/30/2022   CO2 32 11/30/2022   TSH 1.38 11/30/2022   PSA 1.47 11/30/2022   HGBA1C 5.6 11/30/2022   Assessment/Plan:  Geoffrey Evans is a 62 y.o. White or Caucasian [1] male with  has a past  medical history of OSA (obstructive sleep apnea) (2010), Seasonal allergies, and Sleep apnea.  Hives Recent onset , pt educated, advised to keep otc benadryl 50 mg at home and car for any future episodes and epipen prn, but for now for depomedrol 80 mg IM, pred taper, [place] allergy panel and food allergy panels, and consider allergy referral for any recurrence.    Followup: Return if symptoms worsen or fail to improve.  Oliver Barre, MD 11/03/2023 2:21 PM [facility]

## 2023-11-03 NOTE — Addendum Note (Signed)
Addended by: Racheal Patches on: 11/03/2023 02:51 PM   Modules accepted: Orders

## 2023-11-06 LAB — RESPIRATORY ALLERGY PROFILE REGION II ~~LOC~~
Allergen, A. alternata, m6: <0.1 kU/L
Allergen, Cedar tree, t12: 1.59 kU/L — ABNORMAL HIGH
Allergen, Comm Silver Birch, t9: <0.1 kU/L
Allergen, Cottonwood, t14: <0.1 kU/L
Allergen, D pternoyssinus,d7: <0.1 kU/L
Allergen, Mouse Urine Protein, e78: <0.1 kU/L
Allergen, Mulberry, t76: <0.1 kU/L
Allergen, Oak,t7: <0.1 kU/L
Allergen, P. notatum, m1: <0.1 kU/L
Aspergillus fumigatus, m3: <0.1 kU/L
Bermuda Grass: <0.1 kU/L
Box Elder IgE: <0.1 kU/L
CLADOSPORIUM HERBARUM (M2) IGE: <0.1 kU/L
COMMON RAGWEED (SHORT) (W1) IGE: <0.1 kU/L
Cat Dander: <0.1 kU/L
Class: 0
Class: 0
Class: 0
Class: 0
Class: 0
Class: 0
Class: 0
Class: 0
Class: 0
Class: 0
Class: 0
Class: 0
Class: 0
Class: 0
Class: 0
Class: 0
Class: 0
Class: 0
Class: 0
Class: 0
Class: 0
Class: 0
Class: 0
Class: 2
Cockroach: <0.1 kU/L
D. farinae: <0.1 kU/L
Dog Dander: <0.1 kU/L
Elm IgE: <0.1 kU/L
IgE (Immunoglobulin E), Serum: 100 kU/L (ref <=114)
Johnson Grass: <0.1 kU/L
Pecan/Hickory Tree IgE: <0.1 kU/L
Rough Pigweed  IgE: <0.1 kU/L
Sheep Sorrel IgE: <0.1 kU/L
Timothy Grass: <0.1 kU/L

## 2023-11-06 LAB — FOOD ALLERGY PROFILE
Allergen, Salmon, f41: <0.1 kU/L
Almonds: <0.1 kU/L
CLASS: 0
CLASS: 0
CLASS: 0
CLASS: 0
CLASS: 0
CLASS: 0
CLASS: 0
CLASS: 0
CLASS: 0
CLASS: 0
Cashew IgE: <0.1 kU/L
Class: 0
Class: 0
Class: 1
Class: 2
Egg White IgE: 1.32 kU/L — ABNORMAL HIGH
Fish Cod: <0.1 kU/L
Hazelnut: <0.1 kU/L
Milk IgE: 0.35 kU/L — ABNORMAL HIGH
Peanut IgE: <0.1 kU/L
Scallop IgE: <0.1 kU/L
Sesame Seed f10: <0.1 kU/L
Shrimp IgE: <0.1 kU/L
Soybean IgE: 0.14 kU/L — ABNORMAL HIGH
Tuna IgE: <0.1 kU/L
Walnut: <0.1 kU/L
Wheat IgE: <0.1 kU/L

## 2023-11-06 LAB — INTERPRETATION:

## 2023-12-05 ENCOUNTER — Encounter: Payer: Self-pay | Admitting: Internal Medicine

## 2023-12-05 ENCOUNTER — Ambulatory Visit (INDEPENDENT_AMBULATORY_CARE_PROVIDER_SITE_OTHER): Payer: 59 | Admitting: Internal Medicine

## 2023-12-05 VITALS — BP 128/74 | HR 72 | Temp 97.7°F | Ht 71.0 in | Wt 276.0 lb

## 2023-12-05 DIAGNOSIS — N521 Erectile dysfunction due to diseases classified elsewhere: Secondary | ICD-10-CM

## 2023-12-05 DIAGNOSIS — Z6838 Body mass index (BMI) 38.0-38.9, adult: Secondary | ICD-10-CM | POA: Diagnosis not present

## 2023-12-05 DIAGNOSIS — Z0000 Encounter for general adult medical examination without abnormal findings: Secondary | ICD-10-CM | POA: Diagnosis not present

## 2023-12-05 DIAGNOSIS — E781 Pure hyperglyceridemia: Secondary | ICD-10-CM

## 2023-12-05 DIAGNOSIS — E785 Hyperlipidemia, unspecified: Secondary | ICD-10-CM | POA: Diagnosis not present

## 2023-12-05 DIAGNOSIS — E66812 Obesity, class 2: Secondary | ICD-10-CM

## 2023-12-05 DIAGNOSIS — Z0001 Encounter for general adult medical examination with abnormal findings: Secondary | ICD-10-CM

## 2023-12-05 LAB — BASIC METABOLIC PANEL
BUN: 15 mg/dL (ref 6–23)
CO2: 30 meq/L (ref 19–32)
Calcium: 9 mg/dL (ref 8.4–10.5)
Chloride: 102 meq/L (ref 96–112)
Creatinine, Ser: 0.96 mg/dL (ref 0.40–1.50)
GFR: 85.51 mL/min (ref >60.00)
Glucose, Bld: 77 mg/dL (ref 70–99)
Potassium: 4.3 meq/L (ref 3.5–5.1)
Sodium: 139 meq/L (ref 135–145)

## 2023-12-05 LAB — CBC WITH DIFFERENTIAL/PLATELET
Basophils Absolute: 0 10*3/uL (ref 0.0–0.1)
Basophils Relative: 0.4 % (ref 0.0–3.0)
Eosinophils Absolute: 0.1 10*3/uL (ref 0.0–0.7)
Eosinophils Relative: 1.5 % (ref 0.0–5.0)
HCT: 46.3 % (ref 39.0–52.0)
Hemoglobin: 15.2 g/dL (ref 13.0–17.0)
Lymphocytes Relative: 18.4 % (ref 12.0–46.0)
Lymphs Abs: 1.5 10*3/uL (ref 0.7–4.0)
MCHC: 32.9 g/dL (ref 30.0–36.0)
MCV: 88.3 fL (ref 78.0–100.0)
Monocytes Absolute: 0.7 10*3/uL (ref 0.1–1.0)
Monocytes Relative: 9.2 % (ref 3.0–12.0)
Neutro Abs: 5.7 10*3/uL (ref 1.4–7.7)
Neutrophils Relative %: 70.5 % (ref 43.0–77.0)
Platelets: 239 10*3/uL (ref 150.0–400.0)
RBC: 5.24 Mil/uL (ref 4.22–5.81)
RDW: 14.5 % (ref 11.5–15.5)
WBC: 8.1 10*3/uL (ref 4.0–10.5)

## 2023-12-05 LAB — TSH: TSH: 1.72 u[IU]/mL (ref 0.35–5.50)

## 2023-12-05 LAB — HEPATIC FUNCTION PANEL
ALT: 17 U/L (ref 0–53)
AST: 19 U/L (ref 0–37)
Albumin: 4.2 g/dL (ref 3.5–5.2)
Alkaline Phosphatase: 62 U/L (ref 39–117)
Bilirubin, Direct: 0.1 mg/dL (ref 0.0–0.3)
Total Bilirubin: 0.4 mg/dL (ref 0.2–1.2)
Total Protein: 6.7 g/dL (ref 6.0–8.3)

## 2023-12-05 LAB — LIPID PANEL
Cholesterol: 181 mg/dL (ref 0–200)
HDL: 37.1 mg/dL — ABNORMAL LOW (ref >39.00)
LDL Cholesterol: 94 mg/dL (ref 0–99)
NonHDL: 144.11
Total CHOL/HDL Ratio: 5
Triglycerides: 252 mg/dL — ABNORMAL HIGH (ref 0.0–149.0)
VLDL: 50.4 mg/dL — ABNORMAL HIGH (ref 0.0–40.0)

## 2023-12-05 LAB — PSA: PSA: 2.23 ng/mL (ref 0.10–4.00)

## 2023-12-05 LAB — HEMOGLOBIN A1C: Hgb A1c MFr Bld: 5.7 % (ref 4.6–6.5)

## 2023-12-05 MED ORDER — VARDENAFIL HCL 10 MG PO TABS: 10.0000 mg | ORAL_TABLET | Freq: Every day | ORAL | 5 refills | Status: AC | PRN

## 2023-12-05 NOTE — Progress Notes (Unsigned)
Subjective:  Patient ID: Geoffrey Evans, male    DOB: 21-Nov-1961  Age: 62 y.o. MRN: [id number]  CC: Hyperlipidemia   HPI Geoffrey Evans presents for a CPX and f/up ----  He walks 7k steps per day and has good endurance. He denies DOE, CP, edema, fatigue.  Discussed the use of AI scribe software for clinical note transcription with the patient, who gave verbal consent to proceed.  History of Present Illness   The patient, who has been traveling extensively for work, reports no recent chest pain, shortness of breath, dizziness, or lightheadedness. He has been less active than desired due to work commitments and a previous shoulder issue. The shoulder condition, which was treated by a specialist last summer, has since resolved and is no longer a source of discomfort or limitation. The patient denies any current pain and is not taking any pain medication. He has received both the flu and COVID vaccines.       Outpatient Medications Prior to Visit  Medication Sig Dispense Refill   Ascorbic Acid (VITAMIN C PO) Take by mouth as needed. "WHEN FEEL A COLD COMING ON"     EPINEPHrine (EPIPEN 2-PAK) 0.3 mg/0.3 mL IJ SOAJ injection Inject 0.3 mg into the muscle as needed for anaphylaxis. 1 each 2   Multiple Vitamins-Minerals (MULTIVITAMIN WITH MINERALS) tablet Take 1 tablet by mouth daily.     vardenafil (LEVITRA) 10 MG tablet Take 1 tablet (10 mg total) by mouth daily as needed for erectile dysfunction. 10 tablet 5   predniSONE (DELTASONE) 10 MG tablet 3 tabs by mouth per day for 3 days,2tabs per day for 3 days,1tab per day for 3 days 18 tablet 0   No facility-administered medications prior to visit.    ROS Review of Systems  Constitutional: Negative.  Negative for appetite change, chills, diaphoresis, fatigue and fever.  HENT:  Positive for hearing loss. Negative for sore throat and trouble swallowing.   Eyes: Negative.   Respiratory:  Positive for apnea. Negative for cough, chest  tightness, shortness of breath and wheezing.   Cardiovascular:  Negative for chest pain, palpitations and leg swelling.  Gastrointestinal:  Negative for abdominal pain, blood in stool, constipation, diarrhea, nausea and vomiting.  Endocrine: Negative.   Genitourinary: Negative.  Negative for difficulty urinating and dysuria.       ++ED  Musculoskeletal: Negative.  Negative for arthralgias, myalgias and neck pain.  Skin: Negative.   Neurological:  Negative for dizziness and weakness.  Hematological:  Negative for adenopathy. Does not bruise/bleed easily.    Objective:  BP 128/74 (BP Location: Right Arm, Patient Position: Sitting, Cuff Size: Normal)   Pulse 72   Temp 97.7 F (36.5 C) (Oral)   Ht 5\' 11"  (1.803 m)   Wt 276 lb (125.2 kg)   SpO2 96%   BMI 38.49 kg/m   BP Readings from Last 3 Encounters:  12/05/23 128/74  11/03/23 138/80  08/14/23 138/84    Wt Readings from Last 3 Encounters:  12/05/23 276 lb (125.2 kg)  11/03/23 278 lb (126.1 kg)  08/14/23 274 lb (124.3 kg)    Physical Exam Vitals reviewed.  Constitutional:      General: He is not in acute distress.    Appearance: He is obese. He is not toxic-appearing or diaphoretic.  Eyes:     General: No scleral icterus.    Conjunctiva/sclera: Conjunctivae normal.  Cardiovascular:     Rate and Rhythm: Normal rate and regular rhythm.  Heart sounds: No murmur heard.    No friction rub. No gallop.  Pulmonary:     Effort: Pulmonary effort is normal.     Breath sounds: No stridor. No wheezing, rhonchi or rales.  Abdominal:     General: Abdomen is flat.     Palpations: There is no mass.     Tenderness: There is no abdominal tenderness. There is no guarding.     Hernia: No hernia is present. There is no hernia in the left inguinal area or right inguinal area.  Genitourinary:    Pubic Area: No rash.      Penis: Normal.      Testes: Normal.     Epididymis:     Right: Normal.     Left: Normal.     Prostate: Normal.  Not enlarged, not tender and no nodules present.     Rectum: Normal. Guaiac result negative. No mass, tenderness, anal fissure, external hemorrhoid or internal hemorrhoid. Normal anal tone.  Musculoskeletal:        General: Normal range of motion.     Cervical back: Neck supple.     Right lower leg: No edema.     Left lower leg: No edema.  Lymphadenopathy:     Cervical: No cervical adenopathy.     Lower Body: No right inguinal adenopathy. No left inguinal adenopathy.  Skin:    General: Skin is warm and dry.     Findings: No rash.  Neurological:     General: No focal deficit present.     Mental Status: He is alert. Mental status is at baseline.  Psychiatric:        Mood and Affect: Mood normal.        Behavior: Behavior normal.        Thought Content: Thought content normal.     Lab Results  Component Value Date   WBC 8.1 12/05/2023   HGB 15.2 12/05/2023   HCT 46.3 12/05/2023   PLT 239.0 12/05/2023   GLUCOSE 77 12/05/2023   CHOL 181 12/05/2023   TRIG 252.0 (H) 12/05/2023   HDL 37.10 (L) 12/05/2023   LDLDIRECT 116.0 09/23/2021   LDLCALC 94 12/05/2023   ALT 17 12/05/2023   AST 19 12/05/2023   NA 139 12/05/2023   K 4.3 12/05/2023   CL 102 12/05/2023   CREATININE 0.96 12/05/2023   BUN 15 12/05/2023   CO2 30 12/05/2023   TSH 1.72 12/05/2023   PSA 2.23 12/05/2023   HGBA1C 5.7 12/05/2023    DG Chest Portable 1 View  Result Date: 08/22/2021 CLINICAL DATA:  COVID positive with cough. EXAM: PORTABLE CHEST 1 VIEW COMPARISON:  None. FINDINGS: There is no evidence of acute infiltrate, pleural effusion or pneumothorax. The heart size and mediastinal contours are within normal limits. The visualized skeletal structures are unremarkable. IMPRESSION: No active cardiopulmonary disease. Electronically Signed   By: Aram Candela M.D.   On: 08/22/2021 23:50    Assessment & Plan:  Encounter for general adult medical examination with abnormal findings -     PSA; Future  Pure  hyperglyceridemia -     Lipid panel; Future -     Hepatic function panel; Future  Class 2 severe obesity due to excess calories with serious comorbidity and body mass index (BMI) of 38.0 to 38.9 in adult (HCC) -     CBC with Differential/Platelet; Future -     Hepatic function panel; Future -     Hemoglobin A1c; Future -  TSH; Future -     Basic metabolic panel; Future  Erectile dysfunction due to diseases classified elsewhere -     Vardenafil HCl; Take 1 tablet (10 mg total) by mouth daily as needed for erectile dysfunction.  Dispense: 10 tablet; Refill: 5     Follow-up: Return in about 6 months (around 06/04/2024).  Sanda Linger, MD

## 2023-12-05 NOTE — Patient Instructions (Signed)
Health Maintenance, Male Adopting a healthy lifestyle and getting preventive care are important in promoting health and wellness. Ask your health care provider about: The right schedule for you to have regular tests and exams. Things you can do on your own to prevent diseases and keep yourself healthy. What should I know about diet, weight, and exercise? Eat a healthy diet  Eat a diet that includes plenty of vegetables, fruits, low-fat dairy products, and lean protein. Do not eat a lot of foods that are high in solid fats, added sugars, or sodium. Maintain a healthy weight Body mass index (BMI) is a measurement that can be used to identify possible weight problems. It estimates body fat based on height and weight. Your health care provider can help determine your BMI and help you achieve or maintain a healthy weight. Get regular exercise Get regular exercise. This is one of the most important things you can do for your health. Most adults should: Exercise for at least 150 minutes each week. The exercise should increase your heart rate and make you sweat (moderate-intensity exercise). Do strengthening exercises at least twice a week. This is in addition to the moderate-intensity exercise. Spend less time sitting. Even light physical activity can be beneficial. Watch cholesterol and blood lipids Have your blood tested for lipids and cholesterol at 61 years of age, then have this test every 5 years. You may need to have your cholesterol levels checked more often if: Your lipid or cholesterol levels are high. You are older than 61 years of age. You are at high risk for heart disease. What should I know about cancer screening? Many types of cancers can be detected early and may often be prevented. Depending on your health history and family history, you may need to have cancer screening at various ages. This may include screening for: Colorectal cancer. Prostate cancer. Skin cancer. Lung  cancer. What should I know about heart disease, diabetes, and high blood pressure? Blood pressure and heart disease High blood pressure causes heart disease and increases the risk of stroke. This is more likely to develop in people who have high blood pressure readings or are overweight. Talk with your health care provider about your target blood pressure readings. Have your blood pressure checked: Every 3-5 years if you are 18-39 years of age. Every year if you are 40 years old or older. If you are between the ages of 65 and 75 and are a current or former smoker, ask your health care provider if you should have a one-time screening for abdominal aortic aneurysm (AAA). Diabetes Have regular diabetes screenings. This checks your fasting blood sugar level. Have the screening done: Once every three years after age 45 if you are at a normal weight and have a low risk for diabetes. More often and at a younger age if you are overweight or have a high risk for diabetes. What should I know about preventing infection? Hepatitis B If you have a higher risk for hepatitis B, you should be screened for this virus. Talk with your health care provider to find out if you are at risk for hepatitis B infection. Hepatitis C Blood testing is recommended for: Everyone born from 1945 through 1965. Anyone with known risk factors for hepatitis C. Sexually transmitted infections (STIs) You should be screened each year for STIs, including gonorrhea and chlamydia, if: You are sexually active and are younger than 61 years of age. You are older than 61 years of age and your   health care provider tells you that you are at risk for this type of infection. Your sexual activity has changed since you were last screened, and you are at increased risk for chlamydia or gonorrhea. Ask your health care provider if you are at risk. Ask your health care provider about whether you are at high risk for HIV. Your health care provider  may recommend a prescription medicine to help prevent HIV infection. If you choose to take medicine to prevent HIV, you should first get tested for HIV. You should then be tested every 3 months for as long as you are taking the medicine. Follow these instructions at home: Alcohol use Do not drink alcohol if your health care provider tells you not to drink. If you drink alcohol: Limit how much you have to 0-2 drinks a day. Know how much alcohol is in your drink. In the [place] one drink equals one 12 oz bottle of beer (355 mL), one 5 oz glass of wine (148 mL), or one 1 oz glass of hard liquor (44 mL). Lifestyle Do not use any products that contain nicotine or tobacco. These products include cigarettes, chewing tobacco, and vaping devices, such as e-cigarettes. If you need help quitting, ask your health care provider. Do not use street drugs. Do not share needles. Ask your health care provider for help if you need support or information about quitting drugs. General instructions Schedule regular health, dental, and eye exams. Stay current with your vaccines. Tell your health care provider if: You often feel depressed. You have ever been abused or do not feel safe at home. Summary Adopting a healthy lifestyle and getting preventive care are important in promoting health and wellness. Follow your health care provider's instructions about healthy diet, exercising, and getting tested or screened for diseases. Follow your health care provider's instructions on monitoring your cholesterol and blood pressure. This information is not intended to replace advice given to you by your health care provider. Make sure you discuss any questions you have with your health care provider. Document Revised: 05/03/2021 Document Reviewed: 05/03/2021 Elsevier Patient Education  2024 [employer]vier Inc.  

## 2023-12-06 DIAGNOSIS — Z0001 Encounter for general adult medical examination with abnormal findings: Secondary | ICD-10-CM | POA: Insufficient documentation

## 2023-12-09 DIAGNOSIS — E785 Hyperlipidemia, unspecified: Secondary | ICD-10-CM | POA: Insufficient documentation

## 2023-12-09 MED ORDER — ROSUVASTATIN CALCIUM 5 MG PO TABS: 5.0000 mg | ORAL_TABLET | Freq: Every day | ORAL | 1 refills | Status: DC

## 2023-12-10 ENCOUNTER — Encounter: Payer: Self-pay | Admitting: Internal Medicine

## 2023-12-11 ENCOUNTER — Other Ambulatory Visit: Payer: Self-pay | Admitting: Internal Medicine

## 2023-12-11 DIAGNOSIS — E785 Hyperlipidemia, unspecified: Secondary | ICD-10-CM

## 2023-12-28 ENCOUNTER — Ambulatory Visit
Admission: RE | Admit: 2023-12-28 | Discharge: 2023-12-28 | Disposition: A | Discharge status: Home / Self Care | Payer: No Typology Code available for payment source | Source: Ambulatory Visit | Attending: Internal Medicine | Admitting: Internal Medicine

## 2023-12-28 DIAGNOSIS — E785 Hyperlipidemia, unspecified: Secondary | ICD-10-CM

## 2024-04-12 ENCOUNTER — Other Ambulatory Visit: Payer: Self-pay | Admitting: Internal Medicine

## 2024-04-12 DIAGNOSIS — R911 Solitary pulmonary nodule: Secondary | ICD-10-CM | POA: Insufficient documentation

## 2024-04-26 ENCOUNTER — Ambulatory Visit
Admission: RE | Admit: 2024-04-26 | Discharge: 2024-04-26 | Disposition: A | Discharge status: Home / Self Care | Payer: Self-pay | Source: Ambulatory Visit | Attending: Internal Medicine | Admitting: Internal Medicine

## 2024-04-26 DIAGNOSIS — R911 Solitary pulmonary nodule: Secondary | ICD-10-CM

## 2024-05-08 ENCOUNTER — Ambulatory Visit: Payer: Self-pay | Admitting: Internal Medicine

## 2024-05-08 ENCOUNTER — Telehealth: Payer: Self-pay

## 2024-05-08 NOTE — Telephone Encounter (Signed)
 CRITICAL VALUE STICKER  CRITICAL VALUE: CT Chest Imaging Please advise.   RECEIVER (on-site recipient of call): Jazz   DATE & TIME NOTIFIED: 05/08/2024 4:02 PM   MESSENGER (representative from lab): Diane at [facility].   MD NOTIFIED: Yes

## 2024-09-21 ENCOUNTER — Other Ambulatory Visit: Payer: Self-pay | Admitting: Internal Medicine

## 2024-09-21 DIAGNOSIS — E785 Hyperlipidemia, unspecified: Secondary | ICD-10-CM

## 2024-09-25 NOTE — Telephone Encounter (Unsigned)
 Copied from CRM #[id number]. Topic: Clinical - Medication Question >> Sep 25, 2024  1:24 PM Alfonso HERO wrote: Reason for CRM: Pharmacy calling for status of refill request of rosuvastatin  (CRESTOR ) 5 MG tablet. [facility] [phone]

## 2024-11-01 ENCOUNTER — Other Ambulatory Visit: Payer: Self-pay | Admitting: Internal Medicine

## 2024-11-01 DIAGNOSIS — R911 Solitary pulmonary nodule: Secondary | ICD-10-CM

## 2024-12-10 ENCOUNTER — Ambulatory Visit: Admitting: Internal Medicine

## 2024-12-10 ENCOUNTER — Encounter: Payer: Self-pay | Admitting: Internal Medicine

## 2024-12-10 ENCOUNTER — Ambulatory Visit: Payer: Self-pay | Admitting: Internal Medicine

## 2024-12-10 VITALS — BP 124/82 | HR 56 | Temp 97.7°F | Resp 16 | Ht 71.0 in | Wt 281.6 lb

## 2024-12-10 DIAGNOSIS — R911 Solitary pulmonary nodule: Secondary | ICD-10-CM

## 2024-12-10 DIAGNOSIS — E781 Pure hyperglyceridemia: Secondary | ICD-10-CM | POA: Diagnosis not present

## 2024-12-10 DIAGNOSIS — Z125 Encounter for screening for malignant neoplasm of prostate: Secondary | ICD-10-CM | POA: Diagnosis not present

## 2024-12-10 DIAGNOSIS — R001 Bradycardia, unspecified: Secondary | ICD-10-CM | POA: Diagnosis not present

## 2024-12-10 DIAGNOSIS — Z136 Encounter for screening for cardiovascular disorders: Secondary | ICD-10-CM | POA: Insufficient documentation

## 2024-12-10 DIAGNOSIS — E66812 Obesity, class 2: Secondary | ICD-10-CM | POA: Diagnosis not present

## 2024-12-10 DIAGNOSIS — E785 Hyperlipidemia, unspecified: Secondary | ICD-10-CM

## 2024-12-10 DIAGNOSIS — Z0000 Encounter for general adult medical examination without abnormal findings: Secondary | ICD-10-CM

## 2024-12-10 DIAGNOSIS — Z23 Encounter for immunization: Secondary | ICD-10-CM | POA: Insufficient documentation

## 2024-12-10 DIAGNOSIS — Z0001 Encounter for general adult medical examination with abnormal findings: Secondary | ICD-10-CM

## 2024-12-10 DIAGNOSIS — Z6838 Body mass index (BMI) 38.0-38.9, adult: Secondary | ICD-10-CM | POA: Diagnosis not present

## 2024-12-10 LAB — CBC WITH DIFFERENTIAL/PLATELET
Basophils Absolute: 0 K/uL (ref 0.0–0.1)
Basophils Relative: 0.3 % (ref 0.0–3.0)
Eosinophils Absolute: 0.1 K/uL (ref 0.0–0.7)
Eosinophils Relative: 1.7 % (ref 0.0–5.0)
HCT: 43.6 % (ref 39.0–52.0)
Hemoglobin: 14.7 g/dL (ref 13.0–17.0)
Lymphocytes Relative: 23.1 % (ref 12.0–46.0)
Lymphs Abs: 1.2 K/uL (ref 0.7–4.0)
MCHC: 33.8 g/dL (ref 30.0–36.0)
MCV: 85.5 fl (ref 78.0–100.0)
Monocytes Absolute: 0.6 K/uL (ref 0.1–1.0)
Monocytes Relative: 11.3 % (ref 3.0–12.0)
Neutro Abs: 3.4 K/uL (ref 1.4–7.7)
Neutrophils Relative %: 63.6 % (ref 43.0–77.0)
Platelets: 200 K/uL (ref 150.0–400.0)
RBC: 5.1 Mil/uL (ref 4.22–5.81)
RDW: 14.4 % (ref 11.5–15.5)
WBC: 5.3 K/uL (ref 4.0–10.5)

## 2024-12-10 LAB — BASIC METABOLIC PANEL WITH GFR
BUN: 15 mg/dL (ref 6–23)
CO2: 29 meq/L (ref 19–32)
Calcium: 8.9 mg/dL (ref 8.4–10.5)
Chloride: 105 meq/L (ref 96–112)
Creatinine, Ser: 1 mg/dL (ref 0.40–1.50)
GFR: 80.85 mL/min (ref >60.00)
Glucose, Bld: 96 mg/dL (ref 70–99)
Potassium: 4.5 meq/L (ref 3.5–5.1)
Sodium: 140 meq/L (ref 135–145)

## 2024-12-10 LAB — HEPATIC FUNCTION PANEL
ALT: 22 U/L (ref 0–53)
AST: 22 U/L (ref 5–37)
Albumin: 4 g/dL (ref 3.5–5.2)
Alkaline Phosphatase: 52 U/L (ref 39–117)
Bilirubin, Direct: 0.1 mg/dL (ref 0.0–0.3)
Total Bilirubin: 0.5 mg/dL (ref 0.2–1.2)
Total Protein: 6.4 g/dL (ref 6.0–8.3)

## 2024-12-10 LAB — PSA: PSA: 2.01 ng/mL (ref 0.10–4.00)

## 2024-12-10 LAB — LIPID PANEL
Cholesterol: 151 mg/dL (ref 28–200)
HDL: 35.2 mg/dL — ABNORMAL LOW (ref >39.00)
LDL Cholesterol: 82 mg/dL (ref 0–99)
NonHDL: 115.42
Total CHOL/HDL Ratio: 4
Triglycerides: 165 mg/dL — ABNORMAL HIGH (ref 0.0–149.0)
VLDL: 33 mg/dL (ref 0.0–40.0)

## 2024-12-10 LAB — TSH: TSH: 1.8 u[IU]/mL (ref 0.35–5.50)

## 2024-12-10 LAB — HEMOGLOBIN A1C: Hgb A1c MFr Bld: 5.5 % (ref 4.6–6.5)

## 2024-12-10 MED ORDER — ROSUVASTATIN CALCIUM 5 MG PO TABS: 5.0000 mg | ORAL_TABLET | Freq: Every day | ORAL | 1 refills | Status: AC

## 2024-12-10 NOTE — Patient Instructions (Signed)
 Health Maintenance, Male  Adopting a healthy lifestyle and getting preventive care are important in promoting health and wellness. Ask your health care provider about:  The right schedule for you to have regular tests and exams.  Things you can do on your own to prevent diseases and keep yourself healthy.  What should I know about diet, weight, and exercise?  Eat a healthy diet    Eat a diet that includes plenty of vegetables, fruits, low-fat dairy products, and lean protein.  Do not eat a lot of foods that are high in solid fats, added sugars, or sodium.  Maintain a healthy weight  Body mass index (BMI) is a measurement that can be used to identify possible weight problems. It estimates body fat based on height and weight. Your health care provider can help determine your BMI and help you achieve or maintain a healthy weight.  Get regular exercise  Get regular exercise. This is one of the most important things you can do for your health. Most adults should:  Exercise for at least 150 minutes each week. The exercise should increase your heart rate and make you sweat (moderate-intensity exercise).  Do strengthening exercises at least twice a week. This is in addition to the moderate-intensity exercise.  Spend less time sitting. Even light physical activity can be beneficial.  Watch cholesterol and blood lipids  Have your blood tested for lipids and cholesterol at 62 years of age, then have this test every 5 years.  You may need to have your cholesterol levels checked more often if:  Your lipid or cholesterol levels are high.  You are older than 62 years of age.  You are at high risk for heart disease.  What should I know about cancer screening?  Many types of cancers can be detected early and may often be prevented. Depending on your health history and family history, you may need to have cancer screening at various ages. This may include screening for:  Colorectal cancer.  Prostate cancer.  Skin cancer.  Lung  cancer.  What should I know about heart disease, diabetes, and high blood pressure?  Blood pressure and heart disease  High blood pressure causes heart disease and increases the risk of stroke. This is more likely to develop in people who have high blood pressure readings or are overweight.  Talk with your health care provider about your target blood pressure readings.  Have your blood pressure checked:  Every 3-5 years if you are 24-52 years of age.  Every year if you are 3 years old or older.  If you are between the ages of 60 and 72 and are a current or former smoker, ask your health care provider if you should have a one-time screening for abdominal aortic aneurysm (AAA).  Diabetes  Have regular diabetes screenings. This checks your fasting blood sugar level. Have the screening done:  Once every three years after age 66 if you are at a normal weight and have a low risk for diabetes.  More often and at a younger age if you are overweight or have a high risk for diabetes.  What should I know about preventing infection?  Hepatitis B  If you have a higher risk for hepatitis B, you should be screened for this virus. Talk with your health care provider to find out if you are at risk for hepatitis B infection.  Hepatitis C  Blood testing is recommended for:  Everyone born from [birth year] through 1964.  Anyone  with known risk factors for hepatitis C.  Sexually transmitted infections (STIs)  You should be screened each year for STIs, including gonorrhea and chlamydia, if:  You are sexually active and are younger than 62 years of age.  You are older than 62 years of age and your health care provider tells you that you are at risk for this type of infection.  Your sexual activity has changed since you were last screened, and you are at increased risk for chlamydia or gonorrhea. Ask your health care provider if you are at risk.  Ask your health care provider about whether you are at high risk for HIV. Your health care provider  may recommend a prescription medicine to help prevent HIV infection. If you choose to take medicine to prevent HIV, you should first get tested for HIV. You should then be tested every 3 months for as long as you are taking the medicine.  Follow these instructions at home:  Alcohol use  Do not drink alcohol if your health care provider tells you not to drink.  If you drink alcohol:  Limit how much you have to 0-2 drinks a day.  Know how much alcohol is in your drink. In the [place] one drink equals one 12 oz bottle of beer (355 mL), one 5 oz glass of wine (148 mL), or one 1 oz glass of hard liquor (44 mL).  Lifestyle  Do not use any products that contain nicotine or tobacco. These products include cigarettes, chewing tobacco, and vaping devices, such as e-cigarettes. If you need help quitting, ask your health care provider.  Do not use street drugs.  Do not share needles.  Ask your health care provider for help if you need support or information about quitting drugs.  General instructions  Schedule regular health, dental, and eye exams.  Stay current with your vaccines.  Tell your health care provider if:  You often feel depressed.  You have ever been abused or do not feel safe at home.  Summary  Adopting a healthy lifestyle and getting preventive care are important in promoting health and wellness.  Follow your health care provider's instructions about healthy diet, exercising, and getting tested or screened for diseases.  Follow your health care provider's instructions on monitoring your cholesterol and blood pressure.  This information is not intended to replace advice given to you by your health care provider. Make sure you discuss any questions you have with your health care provider.  Document Revised: 05/03/2021 Document Reviewed: 05/03/2021  Elsevier Patient Education  2024 [employer].

## 2024-12-10 NOTE — Progress Notes (Signed)
 Subjective:  Patient ID: Geoffrey Evans, male    DOB: 12-28-1960  Age: 63 y.o. MRN: [id number]  CC: Annual Exam and Hyperlipidemia   HPI Geoffrey Evans presents for a CPX and f/up   Discussed the use of AI scribe software for clinical note transcription with the patient, who gave verbal consent to proceed.  History of Present Illness Geoffrey Evans is a 63 year old male who presents for follow-up of a lung nodule and routine check-up.  Approximately six months ago, a CT scan identified a lung nodule. He is currently asymptomatic with no cough, wheezing, shortness of breath, fevers, chills, night sweats, or unexplained weight loss. He maintains good sleep quality with the use of a CPAP machine and is fairly active, walking 8,000 to 10,000 steps daily without experiencing chest pain, shortness of breath, dizziness, or lightheadedness during exertion.  He is on a statin medication, initiated last year for hyperlipidemia, and reports no side effects such as muscle aches or abdominal pain. He notes a positive effect on his stomach, feeling less bloated and crampy since starting the medication, despite occasional constipation. He attributes previous stomach discomfort to frequent travel and poor diet.  He confirms fasting since dinner the previous night in preparation for lab work.     Outpatient Medications Prior to Visit  Medication Sig Dispense Refill   Ascorbic Acid (VITAMIN C PO) Take by mouth as needed. WHEN FEEL A COLD COMING ON     EPINEPHrine  (EPIPEN  2-PAK) 0.3 mg/0.3 mL IJ SOAJ injection Inject 0.3 mg into the muscle as needed for anaphylaxis. 1 each 2   Multiple Vitamins-Minerals (MULTIVITAMIN WITH MINERALS) tablet Take 1 tablet by mouth daily.     vardenafil  (LEVITRA ) 10 MG tablet Take 1 tablet (10 mg total) by mouth daily as needed for erectile dysfunction. 10 tablet 5   rosuvastatin  (CRESTOR ) 5 MG tablet TAKE 1 TABLET(5 MG) BY MOUTH DAILY 90 tablet 1   No  facility-administered medications prior to visit.    ROS Review of Systems  Constitutional:  Negative for appetite change, chills, diaphoresis, fatigue and fever.  HENT: Negative.    Eyes: Negative.   Respiratory:  Positive for apnea. Negative for cough, shortness of breath and wheezing.   Cardiovascular:  Negative for chest pain, palpitations and leg swelling.  Gastrointestinal:  Negative for abdominal pain, constipation, diarrhea, nausea and vomiting.  Endocrine: Negative.   Genitourinary: Negative.  Negative for difficulty urinating, dysuria, penile swelling and scrotal swelling.  Musculoskeletal: Negative.  Negative for arthralgias and myalgias.  Skin: Negative.   Neurological:  Negative for dizziness, weakness, light-headedness and numbness.  Hematological:  Negative for adenopathy. Does not bruise/bleed easily.  Psychiatric/Behavioral: Negative.      Objective:  BP 124/82 (BP Location: Left Arm, Patient Position: Sitting, Cuff Size: Normal)   Pulse (!) 56   Temp 97.7 F (36.5 C) (Oral)   Resp 16   Ht 5' 11 (1.803 m)   Wt 281 lb 9.6 oz (127.7 kg)   SpO2 96%   BMI 39.28 kg/m   BP Readings from Last 3 Encounters:  12/10/24 124/82  12/05/23 128/74  11/03/23 138/80    Wt Readings from Last 3 Encounters:  12/10/24 281 lb 9.6 oz (127.7 kg)  12/05/23 276 lb (125.2 kg)  11/03/23 278 lb (126.1 kg)    Physical Exam Vitals reviewed.  Constitutional:      Appearance: Normal appearance.  HENT:     Nose: Nose normal.  Mouth/Throat:     Mouth: Mucous membranes are moist.  Eyes:     General: No scleral icterus.    Conjunctiva/sclera: Conjunctivae normal.  Cardiovascular:     Rate and Rhythm: Regular rhythm. Bradycardia present.     Heart sounds: Normal heart sounds, S1 normal and S2 normal. No murmur heard.    No friction rub. No gallop.     Comments: EKG--- SB, 56 bpm Wandering baseline No LVH or Q waves Pulmonary:     Effort: Pulmonary effort is normal.      Breath sounds: No stridor. No wheezing, rhonchi or rales.  Abdominal:     General: Abdomen is flat.     Palpations: There is no mass.     Tenderness: There is no abdominal tenderness. There is no guarding.     Hernia: No hernia is present. There is no hernia in the left inguinal area or right inguinal area.  Genitourinary:    Pubic Area: No rash.      Penis: Normal and circumcised.      Testes: Normal.     Epididymis:     Right: Normal.     Left: Normal.     Prostate: Normal. Not enlarged, not tender and no nodules present.     Rectum: Normal. Guaiac result negative. No mass, tenderness, anal fissure, external hemorrhoid or internal hemorrhoid. Normal anal tone.  Musculoskeletal:        General: Normal range of motion.     Cervical back: Neck supple.     Right lower leg: No edema.     Left lower leg: No edema.  Lymphadenopathy:     Cervical: No cervical adenopathy.     Lower Body: No right inguinal adenopathy. No left inguinal adenopathy.  Skin:    General: Skin is warm and dry.     Coloration: Skin is not pale.  Neurological:     General: No focal deficit present.     Mental Status: He is alert.  Psychiatric:        Mood and Affect: Mood normal.        Behavior: Behavior normal.     Lab Results  Component Value Date   WBC 5.3 12/10/2024   HGB 14.7 12/10/2024   HCT 43.6 12/10/2024   PLT 200.0 12/10/2024   GLUCOSE 96 12/10/2024   CHOL 151 12/10/2024   TRIG 165.0 (H) 12/10/2024   HDL 35.20 (L) 12/10/2024   LDLDIRECT 116.0 09/23/2021   LDLCALC 82 12/10/2024   ALT 22 12/10/2024   AST 22 12/10/2024   NA 140 12/10/2024   K 4.5 12/10/2024   CL 105 12/10/2024   CREATININE 1.00 12/10/2024   BUN 15 12/10/2024   CO2 29 12/10/2024   TSH 1.80 12/10/2024   PSA 2.01 12/10/2024   HGBA1C 5.5 12/10/2024    CT Chest Wo Contrast Result Date: 05/08/2024 CLINICAL DATA:  Lung nodule, 6-92mm EXAM: CT CHEST WITHOUT CONTRAST TECHNIQUE: Multidetector CT imaging of the chest was  performed following the standard protocol without IV contrast. RADIATION DOSE REDUCTION: This exam was performed according to the departmental dose-optimization program which includes automated exposure control, adjustment of the mA and/or kV according to patient size and/or use of iterative reconstruction technique. COMPARISON:  December 28, 2023 FINDINGS: Cardiovascular: No cardiomegaly or pericardial effusion. No aortic aneurysm. Mediastinum/Nodes: No mediastinal mass. No mediastinal, hilar, or axillary lymphadenopathy. Lungs/Pleura: The midline trachea and bronchi are patent. No focal airspace consolidation, pleural effusion, or pneumothorax. 9 mm nodule associated with  the horizontal fissure (axial 76). 6 mm right lower lobe nodule (axial 72). 3 mm nodule associated with the oblique fissure (axial 52). Minimal fibrolinear scarring in the medial right middle and medial left lower lobes. Musculoskeletal: No acute fracture or destructive bone lesion. Cervicothoracic DISH. Upper Abdomen: No acute abnormality in the partially visualized upper abdomen. IMPRESSION: 1. No acute intrathoracic abnormality. Specifically, no pneumonia, pulmonary edema, or pleural effusion. 2. Similar appearance of a few pulmonary nodules. The largest nodule is a 9 mm nodule associated with the horizontal fissure (axial 76), favored to represent an intrafissural lymph node. 6 mm subpleural nodule in the right lower lobe (axial 72). A repeat chest CT in 18-24 months from today's study is recommended to document long-term stability, as noted in the follow-up recommendations below. Non-contrast chest CT at 3-6 months is recommended. If the nodules are stable at time of repeat CT, then future CT at 18-24 months (from today's scan) is considered optional for low-risk patients, but is recommended for high-risk patients. This recommendation follows the consensus statement: Guidelines for Management of Incidental Pulmonary Nodules Detected on CT  Images: From the Fleischner Society 2017; Radiology 2017; 284:228-243. Electronically Signed   By: Rogelia Myers M.D.   On: 05/08/2024 15:45   The 10-year ASCVD risk score (Arnett DK, et al., 2019) is: 9.6%   Values used to calculate the score:     Age: 5 years     Clinically relevant sex: Male     Is Non-Hispanic African American: No     Diabetic: No     Tobacco smoker: No     Systolic Blood Pressure: 124 mmHg     Is BP treated: No     HDL Cholesterol: 35.2 mg/dL     Total Cholesterol: 151 mg/dL    Assessment & Plan:  Pure hyperglyceridemia -     Lipid panel; Future -     Basic metabolic panel with GFR; Future  Encounter for general adult medical examination with abnormal findings- Exam completed, labs reviewed, vaccines reviewed and updated, cancer screenings addressed, pt ed material was given.  -     Lipid panel; Future -     PSA; Future  Dyslipidemia, goal LDL below 100- LDL goal achieved. Doing well on the statin  -     Lipid panel; Future -     Hepatic function panel; Future -     TSH; Future -     Rosuvastatin  Calcium ; Take 1 tablet (5 mg total) by mouth daily.  Dispense: 90 tablet; Refill: 1  Incidental lung nodule, greater than or equal to 8mm -     CT CHEST WO CONTRAST; Future  Bradycardia- He is asx. -     EKG 12-Lead -     Basic metabolic panel with GFR; Future -     CBC with Differential/Platelet; Future -     TSH; Future  Immunization due -     Pneumococcal conjugate vaccine 20-valent  Class 2 severe obesity due to excess calories with serious comorbidity and body mass index (BMI) of 38.0 to 38.9 in adult -     Hemoglobin A1c; Future     Follow-up: Return in about 6 months (around 06/10/2025).  Debby Molt, MD
